# Patient Record
Sex: Male | Born: 1990 | Race: Black or African American | Hispanic: No | Marital: Single | Smoking: Current some day smoker
Health system: Southern US, Community
[De-identification: ages and names within clinical notes are randomized; demographics above are authoritative.]

## PROBLEM LIST (undated history)

## (undated) DIAGNOSIS — N289 Disorder of kidney and ureter, unspecified: Secondary | ICD-10-CM

---
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
## 2010-10-20 ENCOUNTER — Emergency Department (HOSPITAL_COMMUNITY)
Admission: EM | Admit: 2010-10-20 | Discharge: 2010-10-20 | Payer: Self-pay | Source: Home / Self Care | Admitting: Emergency Medicine

## 2011-07-27 ENCOUNTER — Emergency Department (HOSPITAL_COMMUNITY): Payer: BC Managed Care – PPO

## 2011-07-27 ENCOUNTER — Emergency Department (HOSPITAL_COMMUNITY)
Admission: EM | Admit: 2011-07-27 | Discharge: 2011-07-28 | Disposition: A | Discharge status: Home / Self Care | Payer: BC Managed Care – PPO | Attending: Emergency Medicine | Admitting: Emergency Medicine

## 2011-07-27 DIAGNOSIS — R071 Chest pain on breathing: Secondary | ICD-10-CM | POA: Insufficient documentation

## 2011-09-19 ENCOUNTER — Other Ambulatory Visit: Payer: Self-pay | Admitting: Family Medicine

## 2011-09-19 ENCOUNTER — Ambulatory Visit
Admission: RE | Admit: 2011-09-19 | Discharge: 2011-09-19 | Disposition: A | Discharge status: Home / Self Care | Payer: BC Managed Care – PPO | Source: Ambulatory Visit | Attending: Family Medicine | Admitting: Family Medicine

## 2011-09-19 DIAGNOSIS — R0789 Other chest pain: Secondary | ICD-10-CM

## 2019-12-27 ENCOUNTER — Emergency Department (HOSPITAL_COMMUNITY): Payer: BC Managed Care – PPO

## 2019-12-27 ENCOUNTER — Encounter (HOSPITAL_COMMUNITY): Payer: Self-pay

## 2019-12-27 ENCOUNTER — Emergency Department (HOSPITAL_COMMUNITY)
Admission: EM | Admit: 2019-12-27 | Discharge: 2019-12-27 | Disposition: A | Discharge status: Home / Self Care | Payer: BC Managed Care – PPO | Attending: Emergency Medicine | Admitting: Emergency Medicine

## 2019-12-27 ENCOUNTER — Other Ambulatory Visit: Payer: Self-pay

## 2019-12-27 DIAGNOSIS — N200 Calculus of kidney: Secondary | ICD-10-CM | POA: Diagnosis not present

## 2019-12-27 DIAGNOSIS — F1210 Cannabis abuse, uncomplicated: Secondary | ICD-10-CM | POA: Insufficient documentation

## 2019-12-27 DIAGNOSIS — R109 Unspecified abdominal pain: Secondary | ICD-10-CM | POA: Diagnosis present

## 2019-12-27 HISTORY — DX: Disorder of kidney and ureter, unspecified: N28.9

## 2019-12-27 LAB — URINALYSIS, ROUTINE W REFLEX MICROSCOPIC
Bacteria, UA: NONE SEEN
Bilirubin Urine: NEGATIVE
Glucose, UA: NEGATIVE mg/dL
Hgb urine dipstick: NEGATIVE
Ketones, ur: NEGATIVE mg/dL
Leukocytes,Ua: NEGATIVE
Nitrite: NEGATIVE
Protein, ur: 30 mg/dL — AB
Specific Gravity, Urine: 1.025 (ref 1.005–1.030)
pH: 7 (ref 5.0–8.0)

## 2019-12-27 MED ORDER — KETOROLAC TROMETHAMINE 60 MG/2ML IM SOLN
60.0000 mg | Freq: Once | INTRAMUSCULAR | Status: AC
  Administered 2019-12-27: 60 mg via INTRAMUSCULAR
  Filled 2019-12-27: qty 2

## 2019-12-27 MED ORDER — TAMSULOSIN HCL 0.4 MG PO CAPS: 0.4000 mg | ORAL_CAPSULE | Freq: Every day | ORAL | 0 refills | Status: DC

## 2019-12-27 MED ORDER — OXYCODONE-ACETAMINOPHEN 5-325 MG PO TABS: 1.0000 | ORAL_TABLET | ORAL | 0 refills | Status: DC | PRN

## 2019-12-27 MED ORDER — HYDROMORPHONE HCL 1 MG/ML IJ SOLN
1.0000 mg | Freq: Once | INTRAMUSCULAR | Status: DC
  Filled 2019-12-27: qty 1

## 2019-12-27 NOTE — ED Triage Notes (Signed)
patient c/o right flank pain that radiates into the right groin since this AM. Patient states he has been voiding frequent small amounts.  Patient reports a history of kidney stones.

## 2019-12-27 NOTE — ED Provider Notes (Signed)
[facility]-EMERGENCY DEPT Provider Note   CSN: [id number] Arrival date & time: 12/27/19  1611     History Chief Complaint  Patient presents with  . Flank Pain  . Urinary Frequency    Dennis Matthews is a 29 y.o. male.  29 year old male presents acute onset of right-sided flank pain.  Denies any hematuria or dysuria.  No fever or chills.  Pain radiates to his groin.  Cannot find a place of comfort.  Denies any rashes.  No treatment used prior to arrival.  The makes his symptoms better or worse.        Past Medical History:  Diagnosis Date  . Renal disorder     There are no problems to display for this patient.   History reviewed. No pertinent surgical history.     History reviewed. No pertinent family history.  Social History   Tobacco Use  . Smoking status: Never Smoker  . Smokeless tobacco: Never Used  Substance Use Topics  . Alcohol use: Yes  . Drug use: Not Currently    Types: Marijuana    Home Medications Prior to Admission medications   Not on File    Allergies    Patient has no known allergies.  Review of Systems   Review of Systems  All other systems reviewed and are negative.   Physical Exam Updated Vital Signs BP 120/90 (BP Location: Left Arm)   Pulse 89   Temp 98.8 F (37.1 C) (Oral)   Resp 18   Ht 1.651 m (5\' 5" )   Wt 73.5 kg   SpO2 100%   BMI 26.96 kg/m   Physical Exam Vitals and nursing note reviewed.  Constitutional:      General: He is not in acute distress.    Appearance: Normal appearance. He is well-developed. He is not toxic-appearing.  HENT:     Head: Normocephalic and atraumatic.  Eyes:     General: Lids are normal.     Conjunctiva/sclera: Conjunctivae normal.     Pupils: Pupils are equal, round, and reactive to light.  Neck:     Thyroid: No thyroid mass.     Trachea: No tracheal deviation.  Cardiovascular:     Rate and Rhythm: Normal rate and regular rhythm.     Heart sounds: Normal  heart sounds. No murmur. No gallop.   Pulmonary:     Effort: Pulmonary effort is normal. No respiratory distress.     Breath sounds: Normal breath sounds. No stridor. No decreased breath sounds, wheezing, rhonchi or rales.  Abdominal:     General: Bowel sounds are normal. There is no distension.     Palpations: Abdomen is soft.     Tenderness: There is no abdominal tenderness. There is no rebound.     Comments: No CVA tenderness  Genitourinary:    Penis: Normal.      Testes: Normal.  Musculoskeletal:        General: No tenderness. Normal range of motion.     Cervical back: Normal range of motion and neck supple.  Skin:    General: Skin is warm and dry.     Findings: No abrasion or rash.  Neurological:     Mental Status: He is alert and oriented to person, place, and time.     GCS: GCS eye subscore is 4. GCS verbal subscore is 5. GCS motor subscore is 6.     Cranial Nerves: No cranial nerve deficit.     Sensory: No sensory  deficit.  Psychiatric:        Speech: Speech normal.        Behavior: Behavior normal.     ED Results / Procedures / Treatments   Labs (all labs ordered are listed, but only abnormal results are displayed) Labs Reviewed  URINALYSIS, ROUTINE W REFLEX MICROSCOPIC    EKG None  Radiology No results found.  Procedures Procedures (including critical care time)  Medications Ordered in ED Medications  HYDROmorphone (DILAUDID) injection 1 mg (has no administration in time range)    ED Course  I have reviewed the triage vital signs and the nursing notes.  Pertinent labs & imaging results that were available during my care of the patient were reviewed by me and considered in my medical decision making (see chart for details).    MDM Rules/Calculators/A&P                      Urinalysis negative for infection.  Renal CT shows mild right-sided hydronephrosis with possible 1 mm stone.  Patient medicated with Toradol and feels better.  Will prescribe pain  meds, Flomax, and give referral to urology Final Clinical Impression(s) / ED Diagnoses Final diagnoses:  None    Rx / DC Orders ED Discharge Orders    None       Lacretia Leigh, MD 12/27/19 1914

## 2019-12-27 NOTE — ED Notes (Signed)
Pt A/Ox4 and ambulatory without concern. Pt educated on follow up care and presecretions. Pt verbalized understanding and denies questions.

## 2019-12-28 NOTE — ED Notes (Signed)
Opened chart at pts request to print work note. 

## 2020-10-12 ENCOUNTER — Other Ambulatory Visit: Payer: BC Managed Care – PPO

## 2020-10-12 DIAGNOSIS — Z20822 Contact with and (suspected) exposure to covid-19: Secondary | ICD-10-CM

## 2020-10-15 LAB — SARS-COV-2, NAA 2 DAY TAT

## 2020-10-15 LAB — NOVEL CORONAVIRUS, NAA: SARS-CoV-2, NAA: DETECTED — AB

## 2020-10-16 ENCOUNTER — Telehealth: Payer: Self-pay | Admitting: Adult Health

## 2020-10-16 ENCOUNTER — Ambulatory Visit: Payer: Self-pay | Admitting: *Deleted

## 2020-10-16 NOTE — Telephone Encounter (Signed)
Called to discuss with patient about COVID-19 symptoms and the use of one of the available treatments for those with mild to moderate Covid symptoms and at a high risk of hospitalization.  Pt appears to qualify for outpatient treatment due to co-morbid conditions and/or a member of an at-risk group in accordance with the FDA Emergency Use Authorization.    Symptom onset: 1/9 Vaccinated: No  Booster? No  Immunocompromised? No  Qualifiers: AAM , no vaccine   Patient declines at this time , will call back if changes mind.  Continue with previous covid 19 care instructions   Rubye Oaks NP

## 2020-10-16 NOTE — Telephone Encounter (Signed)
Patient notified of positive COVID-19 test results. Pt verbalized understanding. Pt reports symptoms of fever, cough and now getter better. Criteria for self-isolation if you test positive for COVID-19, regardless of vaccination status:  -If you have mild symptoms that are resolving or have resolved, isolate at home for 5 days since symptoms started AND continue to wear a well-fitted mask when around others in the home and in public for 5 additional days after isolation is completed -If you have a fever and/or moderate to severe symptoms, isolate for at least 10 days since the symptoms started AND until you are fever free for at least 24 hours without the use of fever-reducing medications -If you tested positive and did not have symptoms, isolate for at least 5 days after your positive test  Use over-the-counter medications for symptoms.If you develop respiratory issues/distress, seek medical care in the Emergency Department.  If you must leave home or if you have to be around others please wear a mask. Please limit contact with immediate family members in the home, practice social distancing, frequent handwashing and clean hard surfaces touched frequently with household cleaning products. Members of your household will also need to quarantine and test.You may also be contacted by the health department for follow up. [facility] Department notified.

## 2020-10-17 IMAGING — CT CT RENAL STONE PROTOCOL
2 of 3 series · 16 of 46 positions shown, 18 images · non-contrast
Comparison: None.

CLINICAL DATA: Right flank pain since this morning.

EXAM:
CT ABDOMEN AND PELVIS WITHOUT CONTRAST
TECHNIQUE: Multidetector CT imaging of the abdomen and pelvis was performed
following the standard protocol without IV contrast.

[Series 4: lung bases · axial · 0.68mm/px · z∈[-8,+78]mm · 13 of 51 slices shown, 15 images]
[im 4/51  soft-tissue]
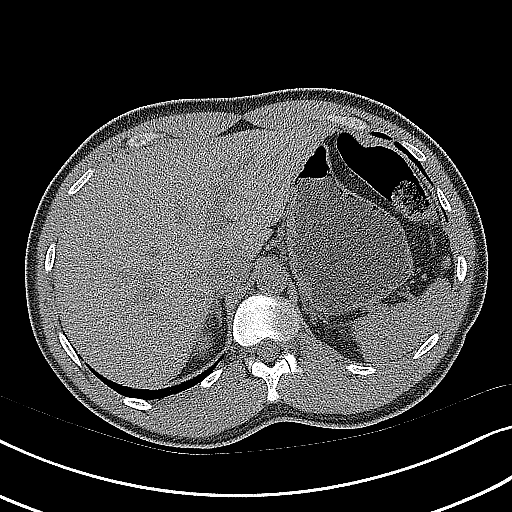
[im 4/51  bone]
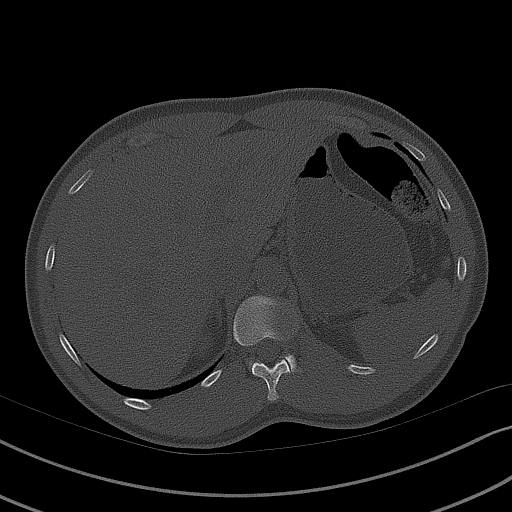
[im 7/51  soft-tissue]
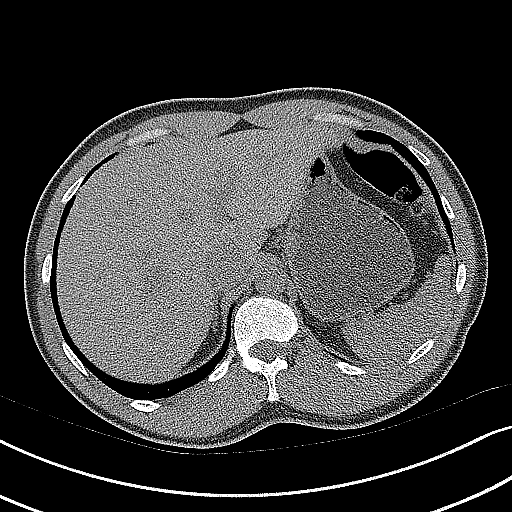
[im 10/51  soft-tissue]
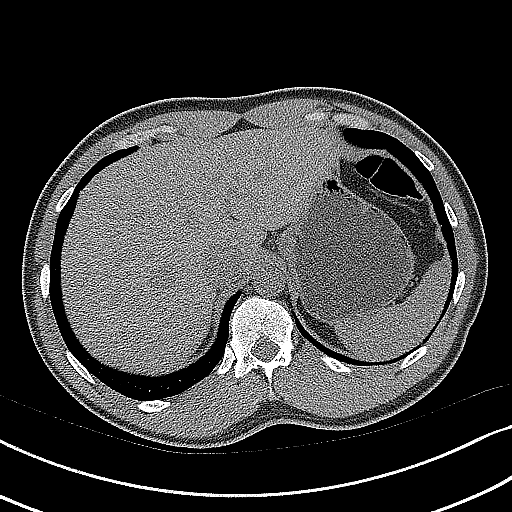
[im 15/51  soft-tissue]
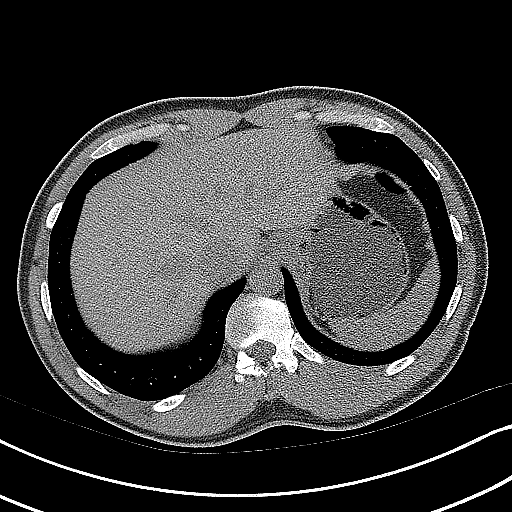
[im 18/51  soft-tissue]
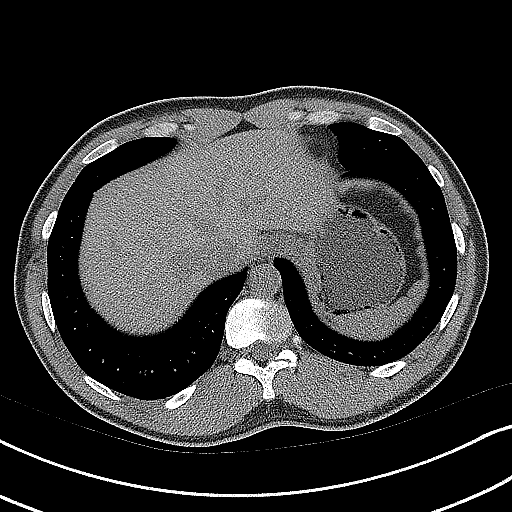
[im 21/51  soft-tissue]
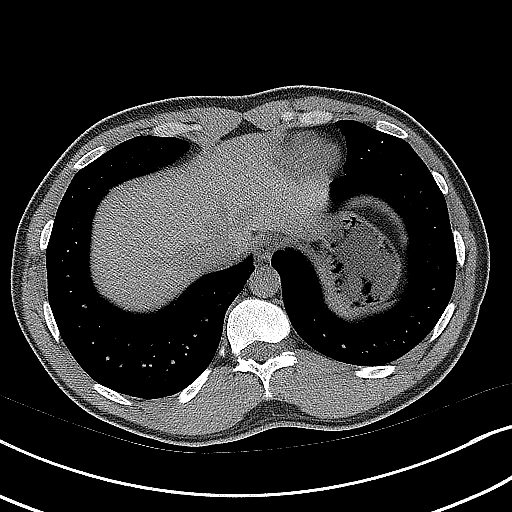
[im 26/51  soft-tissue]
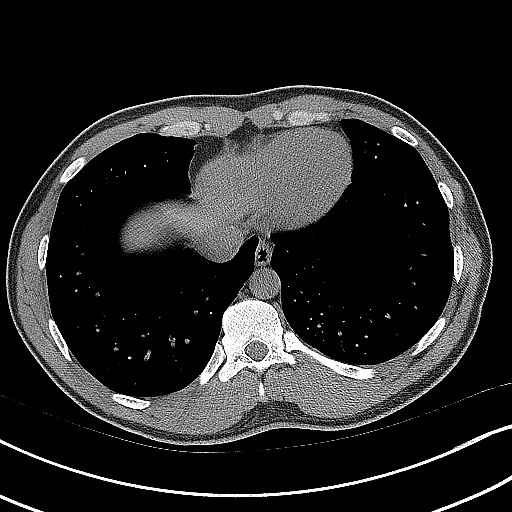
[im 30/51  soft-tissue]
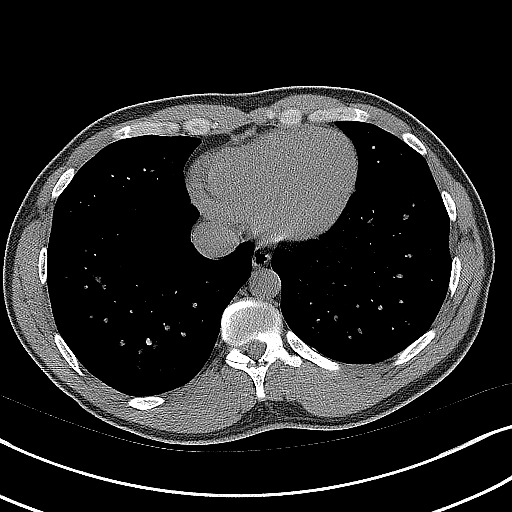
[im 33/51  soft-tissue]
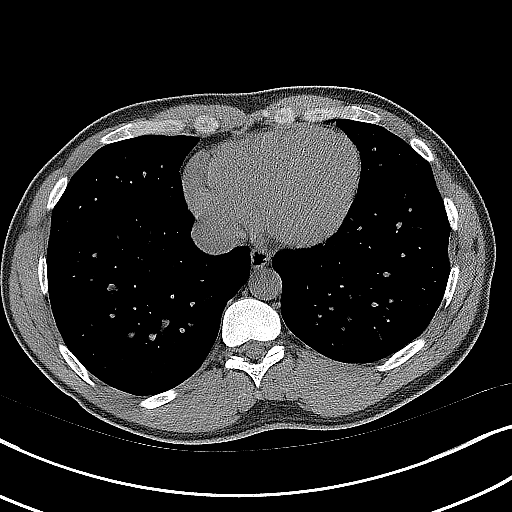
[im 33/51  bone]
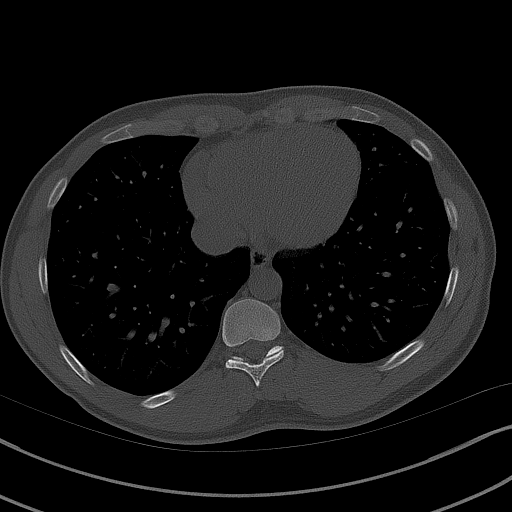
[im 36/51  soft-tissue]
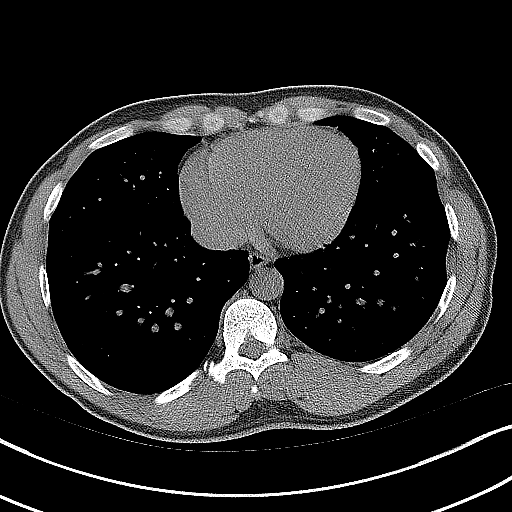
[im 41/51  soft-tissue]
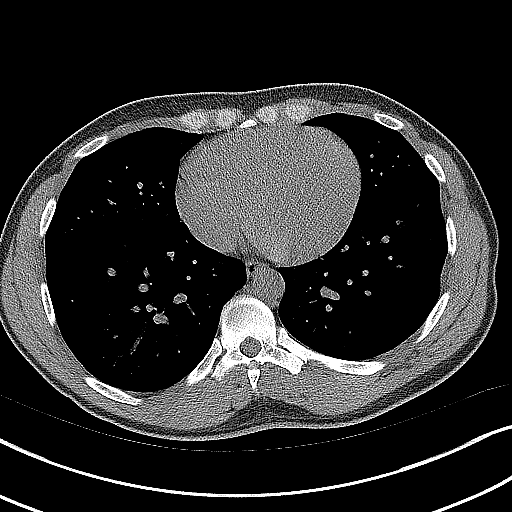
[im 44/51  soft-tissue]
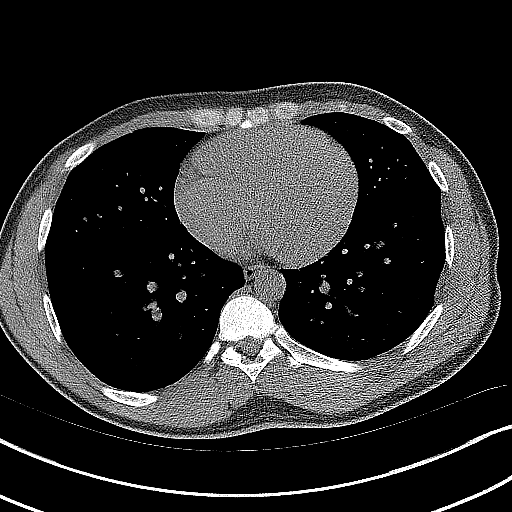
[im 47/51  soft-tissue]
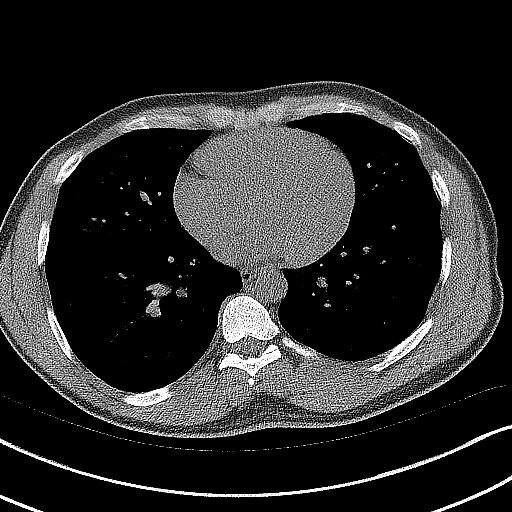

[Series 5: coronal · coronal · 0.69mm/px · 3 of 106 slices shown]
[im 36/106  soft-tissue]
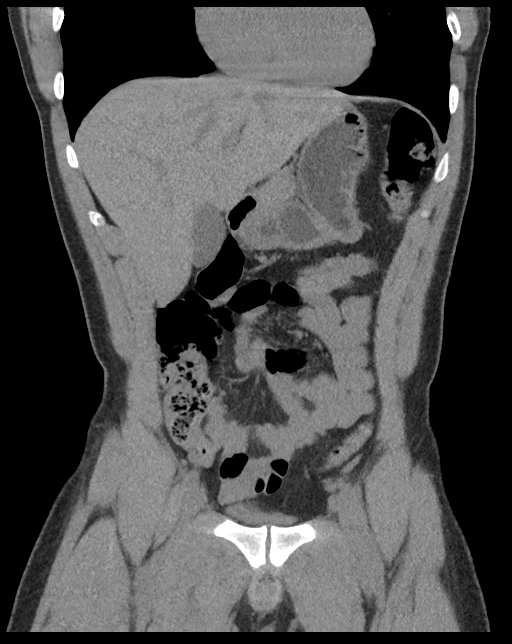
[im 47/106  soft-tissue]
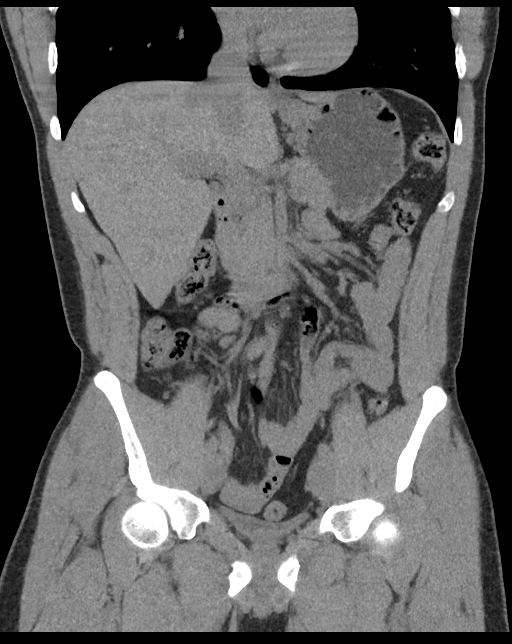
[im 59/106  soft-tissue]
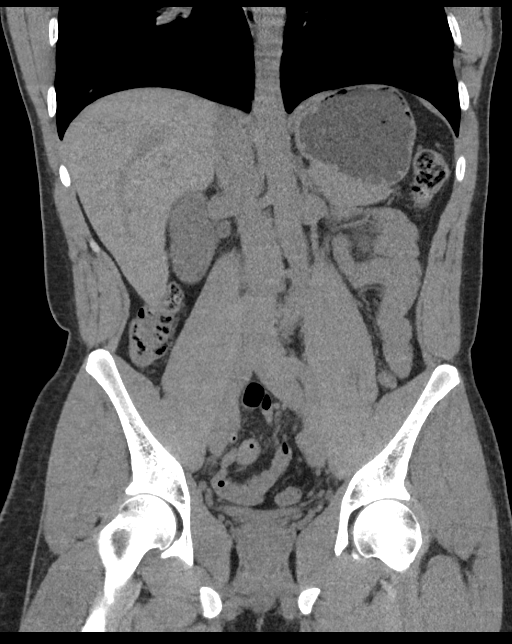

[16 of 46 positions shown; findings below may reference images not displayed]

FINDINGS: Lower chest: No acute abnormality.

Hepatobiliary: No focal liver abnormality is seen. No gallstones,
gallbladder wall thickening, or biliary dilatation.

Pancreas: Unremarkable. No pancreatic ductal dilatation or
surrounding inflammatory changes.

Spleen: Normal in size without focal abnormality.

Adrenals/Urinary Tract: The bilateral adrenal glands are normal.
There is mild right hydronephrosis with question 1 mm stone in the
distal right ureter best seen on image 71 series 2. The left kidney
is normal. There is no left hydronephrosis. The bladder is
decompressed limiting evaluation.

Stomach/Bowel: Stomach is within normal limits. Appendix appears
normal. No evidence of bowel wall thickening, distention, or
inflammatory changes.

Vascular/Lymphatic: No significant vascular findings are present. No
enlarged abdominal or pelvic lymph nodes.

Reproductive: Prostate is unremarkable.

Other: None.

Musculoskeletal: No acute or significant osseous findings.
IMPRESSION: Mild right hydronephrosis with question 1 mm stone in the distal
right ureter.

## 2021-03-10 ENCOUNTER — Emergency Department (HOSPITAL_COMMUNITY)
Admission: EM | Admit: 2021-03-10 | Discharge: 2021-03-11 | Disposition: A | Discharge status: Home / Self Care | Payer: BC Managed Care – PPO | Attending: Emergency Medicine | Admitting: Emergency Medicine

## 2021-03-10 DIAGNOSIS — N50812 Left testicular pain: Secondary | ICD-10-CM

## 2021-03-10 DIAGNOSIS — N50819 Testicular pain, unspecified: Secondary | ICD-10-CM

## 2021-03-10 DIAGNOSIS — Z20822 Contact with and (suspected) exposure to covid-19: Secondary | ICD-10-CM | POA: Diagnosis not present

## 2021-03-10 DIAGNOSIS — N50811 Right testicular pain: Secondary | ICD-10-CM | POA: Diagnosis present

## 2021-03-11 ENCOUNTER — Emergency Department (HOSPITAL_COMMUNITY): Payer: BC Managed Care – PPO

## 2021-03-11 ENCOUNTER — Other Ambulatory Visit: Payer: Self-pay

## 2021-03-11 LAB — URINALYSIS, ROUTINE W REFLEX MICROSCOPIC
Bacteria, UA: NONE SEEN
Bilirubin Urine: NEGATIVE
Glucose, UA: NEGATIVE mg/dL
Hgb urine dipstick: NEGATIVE
Ketones, ur: 80 mg/dL — AB
Leukocytes,Ua: NEGATIVE
Nitrite: NEGATIVE
Protein, ur: NEGATIVE mg/dL
Specific Gravity, Urine: 1.019 (ref 1.005–1.030)
pH: 6 (ref 5.0–8.0)

## 2021-03-11 LAB — RESP PANEL BY RT-PCR (FLU A&B, COVID) ARPGX2
Influenza A by PCR: NEGATIVE
Influenza B by PCR: NEGATIVE
SARS Coronavirus 2 by RT PCR: NEGATIVE

## 2021-03-11 NOTE — ED Triage Notes (Addendum)
Pt states his dad is covid positive x 1wk and wants to get a covid test. Pt states he was rapping in the car and after awhile felt short of breath briefly. Pt also c/o bilateral testicle discomfort moving up to L abd that started approx 1hr ago. Denies n/v/d, fevers or dysuria.

## 2021-03-11 NOTE — Discharge Instructions (Addendum)
Take ibuprofen 600 mg every 6 hours as needed for pain.  Follow-up with your primary doctor if symptoms are not improving in the next few days. 

## 2021-03-11 NOTE — ED Provider Notes (Signed)
[facility]EMERGENCY DEPT Provider Note   CSN: [id number] Arrival date & time: 03/10/21  2320     History Chief Complaint  Patient presents with   Testicle Pain    Dennis Matthews is a 30 y.o. male.  Patient is a 30 year old male with no significant past medical history.  He presents today for evaluation of testicle pain.  He describes discomfort to both testicles since yesterday evening.  This began in the absence of any injury or trauma.  He denies dysuria or urethral discharge.  He states that he has not been sexually active in many months and highly doubts possibility of an STD.  The history is provided by the patient.  Testicle Pain This is a new problem. The current episode started yesterday. The problem occurs constantly. The problem has been gradually worsening. Nothing aggravates the symptoms. Nothing relieves the symptoms.      Past Medical History:  Diagnosis Date   Renal disorder     There are no problems to display for this patient.   No past surgical history on file.     No family history on file.  Social History   Tobacco Use   Smoking status: Never   Smokeless tobacco: Never  Vaping Use   Vaping Use: Never used  Substance Use Topics   Alcohol use: Yes   Drug use: Not Currently    Types: Marijuana    Home Medications Prior to Admission medications   Medication Sig Start Date End Date Taking? Authorizing Provider  oxyCODONE-acetaminophen (PERCOCET/ROXICET) 5-325 MG tablet Take 1-2 tablets by mouth every 4 (four) hours as needed for severe pain. 12/27/19   Lorre Nick, MD  tamsulosin (FLOMAX) 0.4 MG CAPS capsule Take 1 capsule (0.4 mg total) by mouth daily. 12/27/19   Lorre Nick, MD    Allergies    Patient has no known allergies.  Review of Systems   Review of Systems  Genitourinary:  Positive for testicular pain.  All other systems reviewed and are negative.  Physical Exam Updated Vital Signs BP 111/74    Pulse 63   Temp 98.1 F (36.7 C) (Oral)   Resp 18   Ht 5\' 5"  (1.651 m)   Wt 70.3 kg   SpO2 98%   BMI 25.79 kg/m   Physical Exam Vitals and nursing note reviewed.  Constitutional:      General: He is not in acute distress.    Appearance: He is well-developed. He is not diaphoretic.  HENT:     Head: Normocephalic and atraumatic.  Cardiovascular:     Rate and Rhythm: Normal rate and regular rhythm.     Heart sounds: No murmur heard.   No friction rub.  Pulmonary:     Effort: Pulmonary effort is normal. No respiratory distress.     Breath sounds: Normal breath sounds. No wheezing or rales.  Abdominal:     General: Bowel sounds are normal. There is no distension.     Palpations: Abdomen is soft.     Tenderness: There is no abdominal tenderness.  Genitourinary:    Penis: Normal.      Testes: Normal.     Comments: External genitalia is normal in appearance.  There is no evidence for torsion.  Both testicles are freely mobile within the scrotal sac.  There are no rashes or lesions. Musculoskeletal:        General: Normal range of motion.     Cervical back: Normal range of motion and neck supple.  Skin:    General: Skin is warm and dry.  Neurological:     Mental Status: He is alert and oriented to person, place, and time.     Coordination: Coordination normal.    ED Results / Procedures / Treatments   Labs (all labs ordered are listed, but only abnormal results are displayed) Labs Reviewed  RESP PANEL BY RT-PCR (FLU A&B, COVID) ARPGX2  URINALYSIS, ROUTINE W REFLEX MICROSCOPIC  GC/CHLAMYDIA PROBE AMP ([place]) NOT AT [facility]    EKG None  Radiology No results found.  Procedures Procedures   Medications Ordered in ED Medications - No data to display  ED Course  I have reviewed the triage vital signs and the nursing notes.  Pertinent labs & imaging results that were available during my care of the patient were reviewed by me and considered in my medical decision  making (see chart for details).    MDM Rules/Calculators/A&P  Patient's laboratory studies and urinalysis unremarkable.  Ultrasound shows no evidence for torsion or other significant abnormality.  At this point I highly doubt an infectious source.  GC and Chlamydia cultures are pending.  Patient will be discharged with ibuprofen and follow-up as needed.  Patient also request a COVID test due to his father recently testing positive.  His COVID test today is negative.  Final Clinical Impression(s) / ED Diagnoses Final diagnoses:  Testicle pain    Rx / DC Orders ED Discharge Orders     None        Geoffery Lyons, MD 03/11/21 [phone]

## 2021-03-11 NOTE — ED Notes (Signed)
Asked pt to provide urine sample, states he is unable to at this time.

## 2021-03-16 ENCOUNTER — Encounter (HOSPITAL_COMMUNITY): Payer: Self-pay

## 2021-03-16 ENCOUNTER — Ambulatory Visit: Payer: Self-pay | Admitting: *Deleted

## 2021-03-16 ENCOUNTER — Emergency Department (HOSPITAL_COMMUNITY)
Admission: EM | Admit: 2021-03-16 | Discharge: 2021-03-16 | Disposition: A | Discharge status: Home / Self Care | Payer: BC Managed Care – PPO | Attending: Emergency Medicine | Admitting: Emergency Medicine

## 2021-03-16 ENCOUNTER — Other Ambulatory Visit: Payer: Self-pay

## 2021-03-16 DIAGNOSIS — M79605 Pain in left leg: Secondary | ICD-10-CM | POA: Insufficient documentation

## 2021-03-16 DIAGNOSIS — N50812 Left testicular pain: Secondary | ICD-10-CM | POA: Diagnosis not present

## 2021-03-16 DIAGNOSIS — M79604 Pain in right leg: Secondary | ICD-10-CM | POA: Insufficient documentation

## 2021-03-16 DIAGNOSIS — N5082 Scrotal pain: Secondary | ICD-10-CM | POA: Insufficient documentation

## 2021-03-16 DIAGNOSIS — N50811 Right testicular pain: Secondary | ICD-10-CM | POA: Diagnosis present

## 2021-03-16 LAB — URINALYSIS, ROUTINE W REFLEX MICROSCOPIC
Bilirubin Urine: NEGATIVE
Glucose, UA: NEGATIVE mg/dL
Hgb urine dipstick: NEGATIVE
Ketones, ur: NEGATIVE mg/dL
Leukocytes,Ua: NEGATIVE
Nitrite: NEGATIVE
Protein, ur: NEGATIVE mg/dL
Specific Gravity, Urine: 1.003 — ABNORMAL LOW (ref 1.005–1.030)
pH: 6 (ref 5.0–8.0)

## 2021-03-16 MED ORDER — ACETAMINOPHEN 325 MG PO TABS: 650.0000 mg | ORAL_TABLET | Freq: Four times a day (QID) | ORAL | 0 refills | Status: DC | PRN

## 2021-03-16 NOTE — ED Triage Notes (Signed)
Pt c/o bilateral groin and leg pain x 1 wk, seen last week for same. Denies fevers or urinary symptoms

## 2021-03-16 NOTE — ED Provider Notes (Signed)
Emergency Medicine Provider Triage Evaluation Note  Dennis Matthews , a 30 y.o. male  was evaluated in triage.  Pt with vague complaints of ongoing testicle pain (seen in the ED a few days ago for same with normal ultrasound), pain in his hamstrings bilaterally without fall or injury.  Also feels like he has low urine output despite drinking plenty of water and having clear urine.  Patient states that he is in the ER a year ago and told he had a 1 mm kidney stone and he wants to know if he has passed it.  Review of Systems  Positive: Testicle pain, leg pain Negative: Dysuria, hematuria, abdominal pain, flank pain, vomiting  Physical Exam  BP (!) 134/96 (BP Location: Left Arm)   Pulse 76   Temp 97.8 F (36.6 C) (Oral)   Resp 15   SpO2 100%  Gen:   Awake, no distress   Resp:  Normal effort  MSK:   Moves extremities without difficulty  Other:  No CVA tenderness, abdomen is soft and non tender  Medical Decision Making  Medically screening exam initiated at 9:04 PM.  Appropriate orders placed.  [name] was informed that the remainder of the evaluation will be completed by another provider, this initial triage assessment does not replace that evaluation, and the importance of remaining in the ED until their evaluation is complete.     Jeannie Fend, PA-C 03/16/21 2110    Lorre Nick, MD 03/18/21 2253

## 2021-03-16 NOTE — ED Provider Notes (Signed)
[facility]EMERGENCY [facility]PT Provider Note   CSN: [id number] Arrival date & time: 03/16/21  2039     History Chief Complaint  Patient presents with   Groin Swelling    Dennis Matthews is a 30 y.o. male present emergency department with multiple complaints.  The patient reports that he has discontinued discomfort in both of his testicles.  He also was pain that radiates down the back of both of his legs towards his calf.  This began about 2 to 3 weeks ago.  He was seen in the emergency department last week for similar complaints, had an unremarkable scrotal ultrasound at that time.  He denies fevers or chills.  He denies falls, trauma, back injuries.  He denies IV drug use.  No fever, chills, numbness, or objective weakness on exam. No saddle anesthesia, urinary or fecal incontinence or retention. No reported history of immunosuppression, IV drug use, cancer, recent spinal surgery, recent trauma, or falls.    HPI     Past Medical History:  Diagnosis Date   Renal disorder     There are no problems to display for this patient.   History reviewed. No pertinent surgical history.     History reviewed. No pertinent family history.  Social History   Tobacco Use   Smoking status: Never   Smokeless tobacco: Never  Vaping Use   Vaping Use: Never used  Substance Use Topics   Alcohol use: Yes   Drug use: Not Currently    Types: Marijuana    Home Medications Prior to Admission medications   Medication Sig Start Date End Date Taking? Authorizing Provider  acetaminophen (TYLENOL) 325 MG tablet Take 2 tablets (650 mg total) by mouth every 6 (six) hours as needed for up to 30 doses for mild pain or moderate pain. 03/16/21  Yes Gurbani Figge, Kermit Balo, MD  oxyCODONE-acetaminophen (PERCOCET/ROXICET) 5-325 MG tablet Take 1-2 tablets by mouth every 4 (four) hours as needed for severe pain. 12/27/19   Lorre Nick, MD  tamsulosin (FLOMAX) 0.4 MG CAPS capsule Take 1  capsule (0.4 mg total) by mouth daily. 12/27/19   Lorre Nick, MD    Allergies    Patient has no known allergies.  Review of Systems   Review of Systems  Constitutional:  Negative for chills and fever.  Respiratory:  Negative for cough and shortness of breath.   Cardiovascular:  Negative for chest pain and palpitations.  Gastrointestinal:  Negative for abdominal pain and vomiting.  Genitourinary:  Positive for testicular pain. Negative for decreased urine volume, dysuria, hematuria and penile swelling.  Musculoskeletal:  Positive for arthralgias and myalgias. Negative for back pain.  Skin:  Negative for color change and rash.  Neurological:  Negative for weakness, numbness and headaches.  All other systems reviewed and are negative.  Physical Exam Updated Vital Signs BP 130/90 (BP Location: Right Arm)   Pulse 75   Temp 97.9 F (36.6 C) (Oral)   Resp 16   SpO2 100%   Physical Exam Constitutional:      General: He is not in acute distress. HENT:     Head: Normocephalic and atraumatic.  Eyes:     Conjunctiva/sclera: Conjunctivae normal.     Pupils: Pupils are equal, round, and reactive to light.  Cardiovascular:     Rate and Rhythm: Normal rate and regular rhythm.  Pulmonary:     Effort: Pulmonary effort is normal. No respiratory distress.  Abdominal:     General: There is no distension.  Tenderness: There is no abdominal tenderness.  Genitourinary:    Penis: Normal.      Testes: Normal.  Musculoskeletal:        General: No swelling or tenderness.  Skin:    General: Skin is warm and dry.  Neurological:     General: No focal deficit present.     Mental Status: He is alert and oriented to person, place, and time. Mental status is at baseline.     Motor: No weakness.  Psychiatric:        Mood and Affect: Mood normal.        Behavior: Behavior normal.    ED Results / Procedures / Treatments   Labs (all labs ordered are listed, but only abnormal results are  displayed) Labs Reviewed  URINALYSIS, ROUTINE W REFLEX MICROSCOPIC - Abnormal; Notable for the following components:      Result Value   Color, Urine COLORLESS (*)    Specific Gravity, Urine 1.003 (*)    All other components within normal limits    EKG None  Radiology No results found.  Procedures Procedures   Medications Ordered in ED Medications - No data to display  ED Course  I have reviewed the triage vital signs and the nursing notes.  Pertinent labs & imaging results that were available during my care of the patient were reviewed by me and considered in my medical decision making (see chart for details).  This is a young fairly healthy 30 year old male here with nonspecific complaint of scrotal fullness.  He had a normal ultrasound several days ago for the same complaints.  I reviewed his urine today, does not show sign of infection, or blood to suggest a urine stone.  He does not have any inguinal hernia.  No evidence of testicular torsion on exam.  He has a benign GU exam.  Suspect he may be having some sciatic issues in the lower back as it does radiate down both legs towards the calf.  He has negative straight leg test.  He has no neurological deficits.  No red flags for cauda equina syndrome.  I doubt epidural abscess.  I advised continue conservative management with Tylenol for several days.  He has adverse reactions to NSAIDs (stomach upset).  Doubt DVT, cellulitis, infection at this time.     Final Clinical Impression(s) / ED Diagnoses Final diagnoses:  Scrotal pain  Pain in both lower extremities    Rx / DC Orders ED Discharge Orders          Ordered    acetaminophen (TYLENOL) 325 MG tablet  Every 6 hours PRN        03/16/21 2219             Terald Sleeper, MD 03/17/21 1006

## 2021-03-16 NOTE — Telephone Encounter (Signed)
Pt seen in ED 03/10/21 for all symptoms mentioned today plus reports cramping in both hamstrings. States radiates to knees at times, worse at rest, "Not so much when active." Reports 3/10 back pain. States testicular pain remains. Advised UC. States will follow disposition.

## 2021-03-16 NOTE — Telephone Encounter (Signed)
Reason for Disposition . [1] Thigh or calf pain AND [2] only 1 side AND [3] present > 1 hour (Exception: chronic unchanged pain)  Answer Assessment - Initial Assessment Questions 1. ONSET: "When did the pain start?"      2-3 weeks ago 2. LOCATION: "Where is the pain located?"    Both hamstrings 3. PAIN: "How bad is the pain?"    (Scale 1-10; or mild, moderate, severe)   -  MILD (1-3): doesn't interfere with normal activities    -  MODERATE (4-7): interferes with normal activities (e.g., work or school) or awakens from sleep, limping    -  SEVERE (8-10): excruciating pain, unable to do any normal activities, unable to walk     Cramping, none at rest 4. WORK OR EXERCISE: "Has there been any recent work or exercise that involved this part of the body?"      no 5. CAUSE: "What do you think is causing the leg pain?"     Unsure 6. OTHER SYMPTOMS: "Do you have any other symptoms?" (e.g., chest pain, back pain, breathing difficulty, swelling, rash, fever, numbness, weakness)    Testicular pain, back pain 3/10.  Protocols used: Leg Pain-A-AH

## 2021-04-15 ENCOUNTER — Inpatient Hospital Stay (INDEPENDENT_AMBULATORY_CARE_PROVIDER_SITE_OTHER): Payer: BC Managed Care – PPO | Admitting: Primary Care

## 2021-11-18 ENCOUNTER — Ambulatory Visit (INDEPENDENT_AMBULATORY_CARE_PROVIDER_SITE_OTHER): Payer: BC Managed Care – PPO | Admitting: Primary Care

## 2021-12-25 ENCOUNTER — Emergency Department (HOSPITAL_COMMUNITY)
Admission: EM | Admit: 2021-12-25 | Discharge: 2021-12-25 | Disposition: A | Discharge status: Home / Self Care | Payer: BC Managed Care – PPO | Attending: Emergency Medicine | Admitting: Emergency Medicine

## 2021-12-25 ENCOUNTER — Other Ambulatory Visit: Payer: Self-pay

## 2021-12-25 ENCOUNTER — Emergency Department (HOSPITAL_COMMUNITY): Payer: BC Managed Care – PPO

## 2021-12-25 ENCOUNTER — Encounter (HOSPITAL_COMMUNITY): Payer: Self-pay | Admitting: Emergency Medicine

## 2021-12-25 DIAGNOSIS — R002 Palpitations: Secondary | ICD-10-CM | POA: Insufficient documentation

## 2021-12-25 DIAGNOSIS — F1290 Cannabis use, unspecified, uncomplicated: Secondary | ICD-10-CM | POA: Diagnosis not present

## 2021-12-25 DIAGNOSIS — F419 Anxiety disorder, unspecified: Secondary | ICD-10-CM | POA: Diagnosis not present

## 2021-12-25 DIAGNOSIS — R000 Tachycardia, unspecified: Secondary | ICD-10-CM | POA: Insufficient documentation

## 2021-12-25 LAB — BASIC METABOLIC PANEL
Anion gap: 8 (ref 5–15)
BUN: 8 mg/dL (ref 6–20)
CO2: 27 mmol/L (ref 22–32)
Calcium: 9.7 mg/dL (ref 8.9–10.3)
Chloride: 103 mmol/L (ref 98–111)
Creatinine, Ser: 0.89 mg/dL (ref 0.61–1.24)
GFR, Estimated: >60 mL/min (ref >60)
Glucose, Bld: 102 mg/dL — ABNORMAL HIGH (ref 70–99)
Potassium: 3.6 mmol/L (ref 3.5–5.1)
Sodium: 138 mmol/L (ref 135–145)

## 2021-12-25 LAB — CBC
HCT: 46.7 % (ref 39.0–52.0)
Hemoglobin: 15.8 g/dL (ref 13.0–17.0)
MCH: 27.5 pg (ref 26.0–34.0)
MCHC: 33.8 g/dL (ref 30.0–36.0)
MCV: 81.4 fL (ref 80.0–100.0)
Platelets: 233 10*3/uL (ref 150–400)
RBC: 5.74 MIL/uL (ref 4.22–5.81)
RDW: 12.7 % (ref 11.5–15.5)
WBC: 11.7 10*3/uL — ABNORMAL HIGH (ref 4.0–10.5)
nRBC: 0 % (ref 0.0–0.2)

## 2021-12-25 MED ORDER — SODIUM CHLORIDE 0.9 % IV BOLUS
500.0000 mL | Freq: Once | INTRAVENOUS | Status: AC
  Administered 2021-12-25: 500 mL via INTRAVENOUS

## 2021-12-25 NOTE — ED Triage Notes (Signed)
Pt smoked some weed earlier and is feeling anxious. Ambulatory. A&Ox4.  ?

## 2021-12-25 NOTE — ED Provider Notes (Signed)
?[name] ?Provider Note ? ? ?CSN: [id number] ?Arrival date & time: 12/25/21  1913 ? ?  ? ?History ? ?Chief Complaint  ?Patient presents with  ? Anxiety  ? ? ?Dennis Matthews is a 31 y.o. male. ? ?Patient here with palpitations after marijuana use.  No significant medical history.  Denies any chest pain, shortness of breath.  Palpitations are improving.  Does not think there was anything else in the marijuana.  Denies any nausea or vomiting.  No abdominal pain.  No headaches.  No fall or trauma.  Nothing has made it worse or better. ? ?The history is provided by the patient.  ?Anxiety ? ? ?  ? ?Home Medications ?Prior to Admission medications   ?Medication Sig Start Date End Date Taking? Authorizing Provider  ?acetaminophen (TYLENOL) 325 MG tablet Take 2 tablets (650 mg total) by mouth every 6 (six) hours as needed for up to 30 doses for mild pain or moderate pain. 03/16/21   Terald Sleeper, MD  ?oxyCODONE-acetaminophen (PERCOCET/ROXICET) 5-325 MG tablet Take 1-2 tablets by mouth every 4 (four) hours as needed for severe pain. 12/27/19   Lorre Nick, MD  ?tamsulosin (FLOMAX) 0.4 MG CAPS capsule Take 1 capsule (0.4 mg total) by mouth daily. 12/27/19   Lorre Nick, MD  ?   ? ?Allergies    ?Patient has no known allergies.   ? ?Review of Systems   ?Review of Systems ? ?Physical Exam ?Updated Vital Signs ?BP (!) 137/100 (BP Location: Right Arm)   Pulse (!) 115   Temp 98.1 ?F (36.7 ?C) (Oral)   Resp (!) 22   Ht 5\' 5"  (1.651 m)   Wt 70.3 kg   SpO2 96%   BMI 25.79 kg/m?  ?Physical Exam ?Vitals and nursing note reviewed.  ?Constitutional:   ?   General: He is not in acute distress. ?   Appearance: He is well-developed.  ?HENT:  ?   Head: Normocephalic and atraumatic.  ?   Mouth/Throat:  ?   Mouth: Mucous membranes are moist.  ?Eyes:  ?   Extraocular Movements: Extraocular movements intact.  ?   Conjunctiva/sclera: Conjunctivae normal.  ?   Pupils: Pupils are equal, round, and  reactive to light.  ?Cardiovascular:  ?   Rate and Rhythm: Regular rhythm. Tachycardia present.  ?   Heart sounds: No murmur heard. ?Pulmonary:  ?   Effort: Pulmonary effort is normal. No respiratory distress.  ?   Breath sounds: Normal breath sounds.  ?Abdominal:  ?   Palpations: Abdomen is soft.  ?   Tenderness: There is no abdominal tenderness.  ?Musculoskeletal:     ?   General: No swelling.  ?   Cervical back: Neck supple.  ?Skin: ?   General: Skin is warm and dry.  ?   Capillary Refill: Capillary refill takes less than 2 seconds.  ?Neurological:  ?   General: No focal deficit present.  ?   Mental Status: He is alert and oriented to person, place, and time.  ?   Cranial Nerves: No cranial nerve deficit.  ?   Sensory: No sensory deficit.  ?   Motor: No weakness.  ?   Coordination: Coordination normal.  ?   Comments: 5+ out of 5 strength throughout, normal sensation, no drift, normal finger-nose-finger, normal speech  ?Psychiatric:     ?   Mood and Affect: Mood normal.  ? ? ?ED Results / Procedures / Treatments   ?Labs ?(all labs  ordered are listed, but only abnormal results are displayed) ?Labs Reviewed  ?CBC - Abnormal; Notable for the following components:  ?    Result Value  ? WBC 11.7 (*)   ? All other components within normal limits  ?BASIC METABOLIC PANEL - Abnormal; Notable for the following components:  ? Glucose, Bld 102 (*)   ? All other components within normal limits  ? ? ?EKG ?EKG Interpretation ? ?Date/Time:  Saturday December 25 2021 21:02:24 EDT ?Ventricular Rate:  73 ?PR Interval:  181 ?QRS Duration: 100 ?QT Interval:  375 ?QTC Calculation: 414 ?R Axis:   43 ?Text Interpretation: Sinus rhythm Consider left atrial enlargement Confirmed by Virgina Norfolk [phone]) on 12/25/2021 9:06:36 PM ? ?Radiology ?DG Chest 2 View ? ?Result Date: 12/25/2021 ?CLINICAL DATA:  Shortness of breath, marijuana use, anxiety EXAM: CHEST - 2 VIEW COMPARISON:  07/28/2011 FINDINGS: The heart size and mediastinal contours are  within normal limits. Both lungs are clear. The visualized skeletal structures are unremarkable. IMPRESSION: No active cardiopulmonary disease. Electronically Signed   By: Sharlet Salina M.D.   On: 12/25/2021 20:17   ? ?Procedures ?Procedures  ? ? ?Medications Ordered in ED ?Medications  ?sodium chloride 0.9 % bolus 500 mL (500 mLs Intravenous New Bag/Given 12/25/21 2053)  ? ? ?ED Course/ Medical Decision Making/ A&P ?  ?                        ?Medical Decision Making ? ?[name] is here with palpitations and anxiety after smoking marijuana.  Heart rate 115 upon arrival but otherwise normal vitals.  Patient otherwise denies any chest pain, shortness of breath, abdominal pain, nausea, vomiting.  Neurologically he is intact.  Does not think that there is any coingestions.  Overall suspect symptoms secondary to marijuana use.  Will check electrolytes, chest x-ray, EKG to look for any other abnormality in electrolytes or infectious process.  Doubt ACS or PE. ? ?Per my review of labs and interpretation there is no significant anemia, electrolyte abnormality.  Chest x-ray per my review and interpretation shows no pneumonia or pneumothorax.  EKG shows sinus rhythm.  Overall patient feeling better after some IV fluids.  Overall suspect palpitations secondary to marijuana use.  Discharged in good condition. ? ?This chart was dictated using voice recognition software.  Despite best efforts to proofread,  errors can occur which can change the documentation meaning.  ? ? ? ? ? ? ? ?Final Clinical Impression(s) / ED Diagnoses ?Final diagnoses:  ?Palpitations  ? ? ?Rx / DC Orders ?ED Discharge Orders   ? ? None  ? ?  ? ? ?  ?Virgina Norfolk, DO ?12/25/21 2253 ? ?

## 2021-12-25 NOTE — ED Provider Triage Note (Signed)
Emergency Medicine Provider Triage Evaluation Note ? ?[name] , a 31 y.o. male  was evaluated in triage.  Pt complains of smoking weed, drinking alcohol, and energy drinks 2 hours ago.  He states since then he has been feeling anxious, having chest tightness, short of breath.  He states this is happened a couple times before after smoking.  He is without other complaints.  He does appear to be anxious on exam.  He is anxious and requesting to have basic work-up done. ? ?Review of Systems  ?Positive: As above ?Negative: As above ? ?Physical Exam  ?BP (!) 137/100 (BP Location: Right Arm)   Pulse (!) 115   Temp 98.1 ?F (36.7 ?C) (Oral)   Resp (!) 22   Ht 5\' 5"  (1.651 m)   Wt 70.3 kg   SpO2 96%   BMI 25.79 kg/m?  ?Gen:   Awake, no distress   ?Resp:  Normal effort  ?MSK:   Moves extremities without difficulty ?Other:   ? ?Medical Decision Making  ?Medically screening exam initiated at 7:34 PM.  Appropriate orders placed.  was informed that the remainder of the evaluation will be completed by another provider, this initial triage assessment does not replace that evaluation, and the importance of remaining in the ED until their evaluation is complete. ? ? ?  ?[name], PA-C ?12/25/21 1935 ? ?

## 2021-12-31 IMAGING — US US SCROTUM W/ DOPPLER COMPLETE
1 series · 15 of 25 positions shown · non-contrast
Comparison: None.

CLINICAL DATA: Testicular pain

EXAM:
SCROTAL ULTRASOUND
DOPPLER ULTRASOUND OF THE TESTICLES
TECHNIQUE: Complete ultrasound examination of the testicles, epididymis, and
other scrotal structures was performed. Color and spectral Doppler
ultrasound were also utilized to evaluate blood flow to the
testicles.

[Series 1: us art/ven flow abd pelv doppl mc & wl · 15 of 53 slices shown]
[im 1/53]
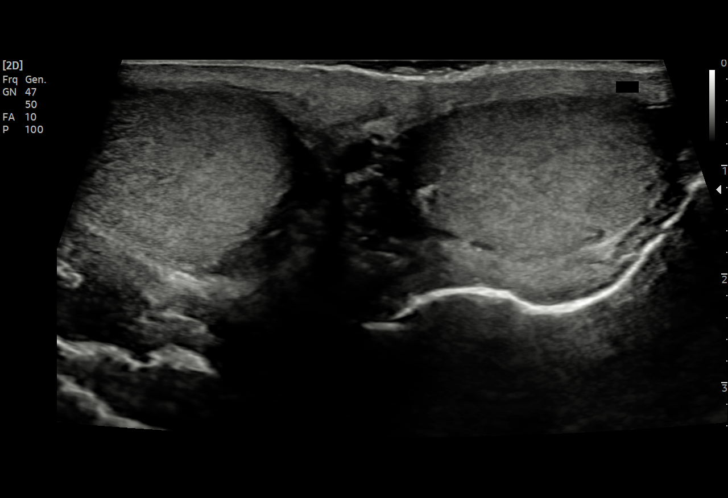
[im 5/53]
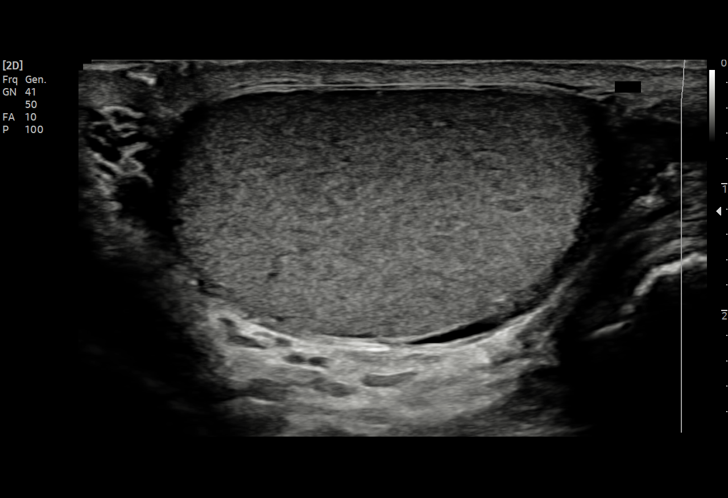
[im 9/53]
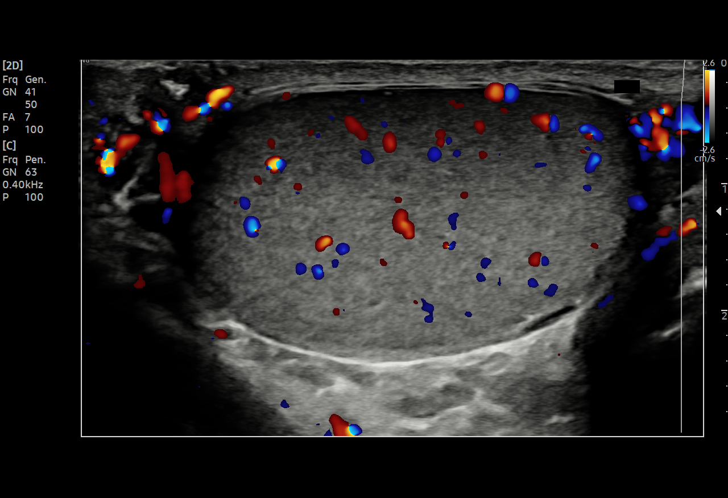
[im 11/53]
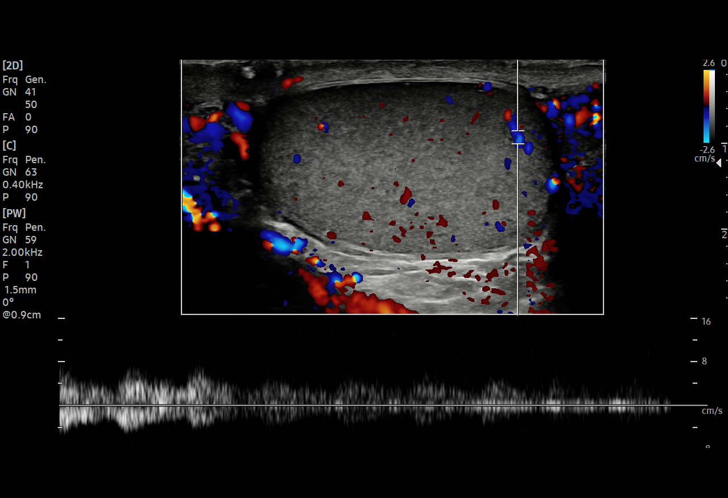
[im 16/53]
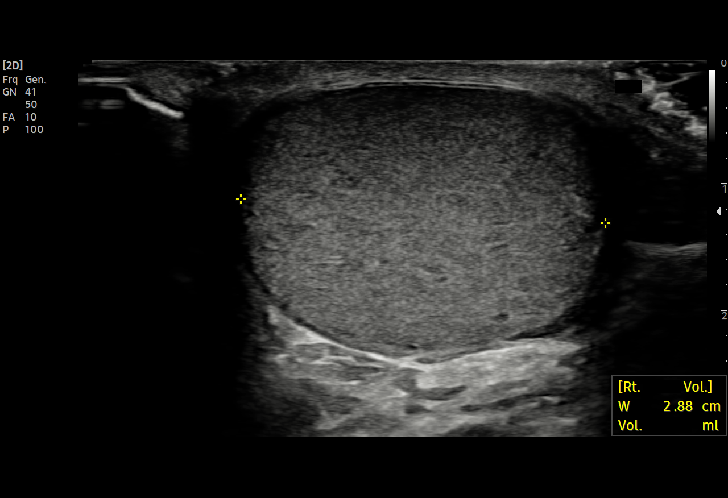
[im 20/53]
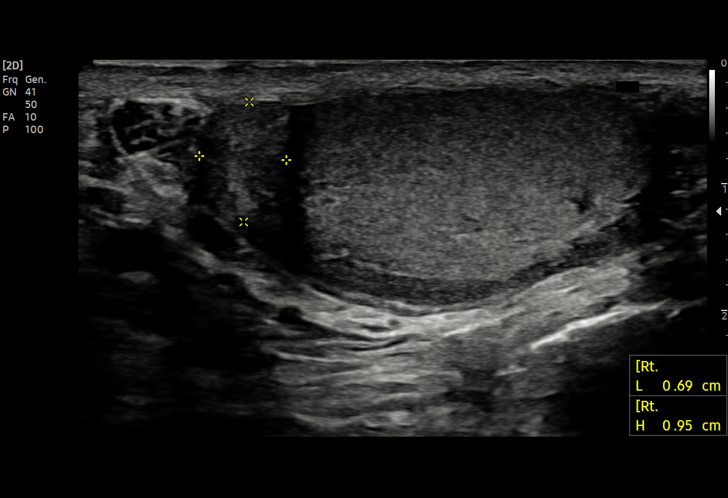
[im 22/53]
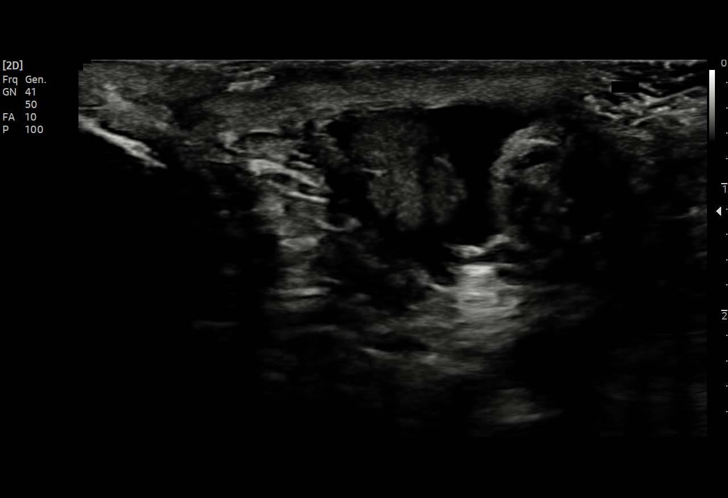
[im 27/53]
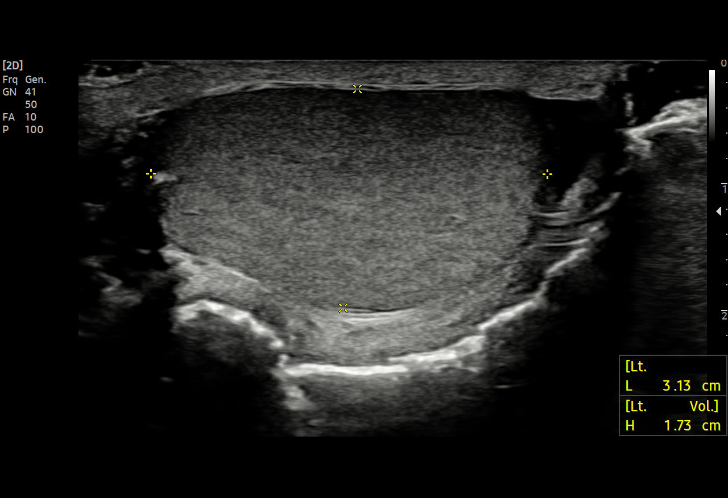
[im 31/53]
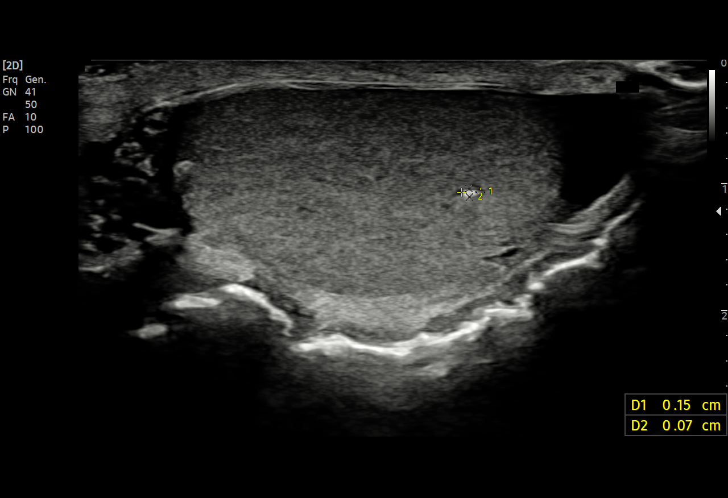
[im 33/53]
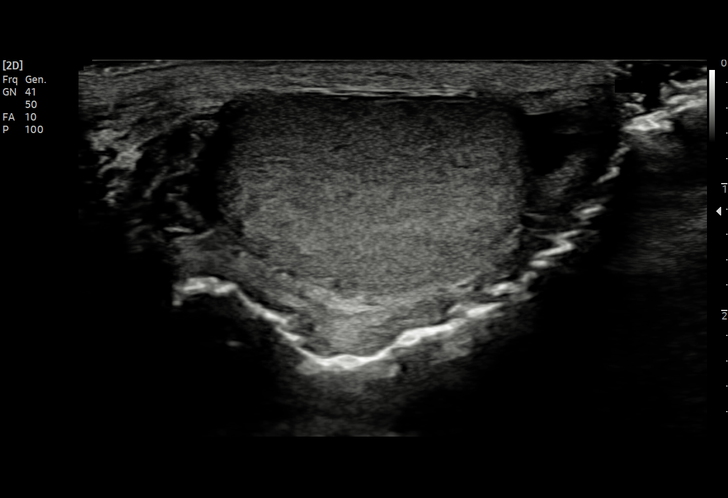
[im 37/53]
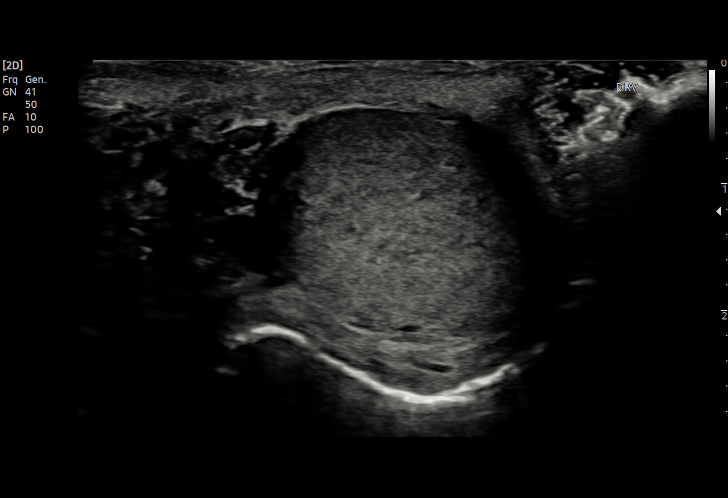
[im 42/53]
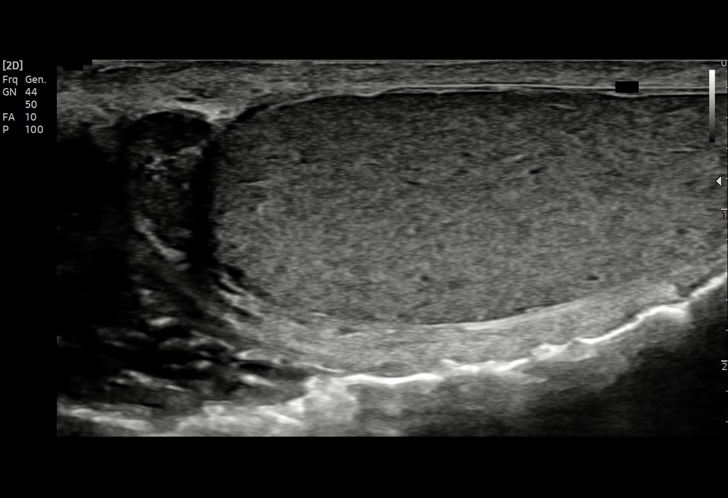
[im 44/53]
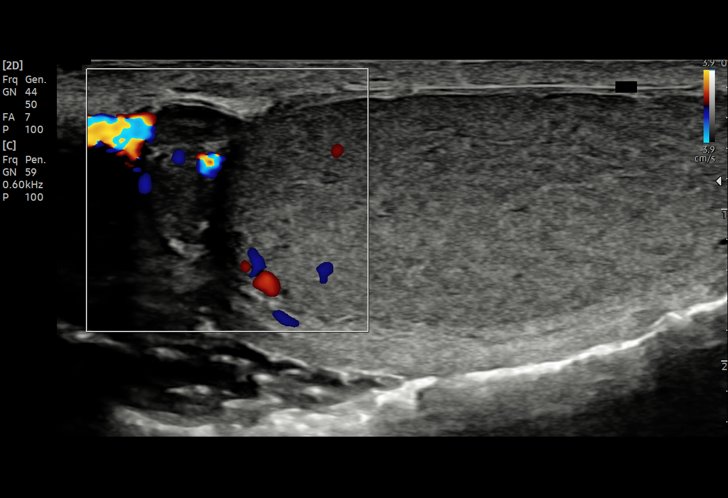
[im 48/53]
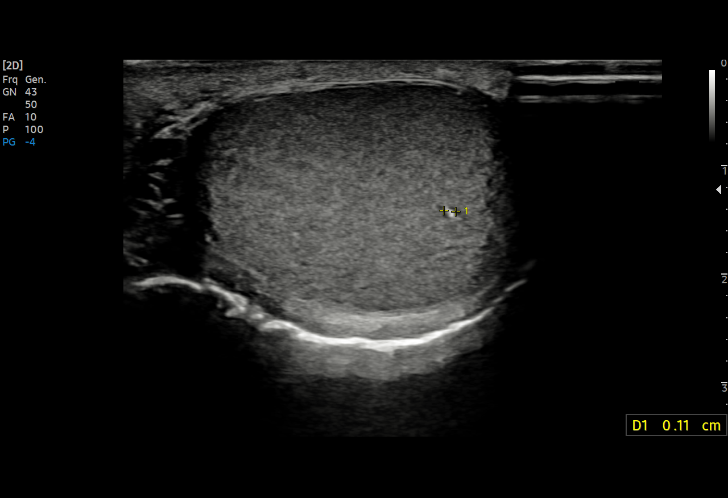
[im 53/53]
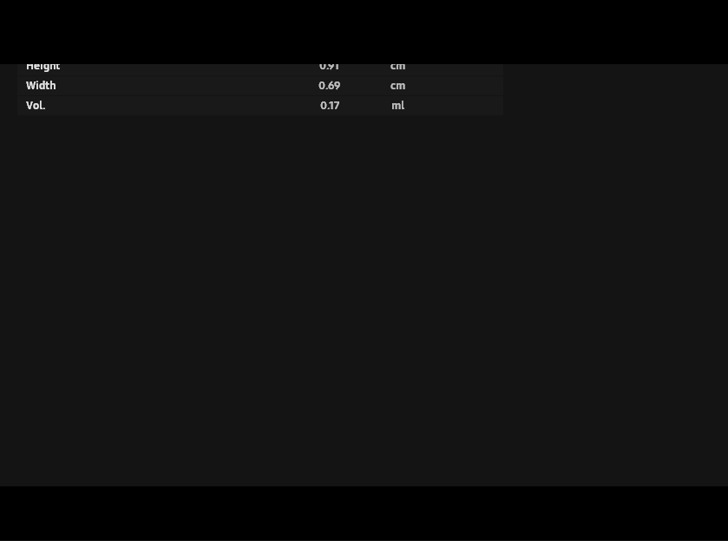

[15 of 25 positions shown; findings below may reference images not displayed]

FINDINGS: Right testicle

Measurements: 3.6 x 1.9 x 2.9 cm. No mass or microlithiasis
visualized.

Left testicle

Measurements: 3.1 x 1.7 x 2.7 cm. No mass. Nonspecific 2 mm
hyperechoic foci.

Right epididymis:  Normal in size and appearance.

Left epididymis:  Normal in size and appearance.

Hydrocele:  Bilateral small, left greater than right.

Varicocele:  None visualized.

Pulsed Doppler interrogation of both testes demonstrates normal low
resistance arterial and venous waveforms bilaterally.
IMPRESSION: Nonspecific left 2 mm hyperechoic foci.

## 2022-02-10 ENCOUNTER — Encounter (HOSPITAL_COMMUNITY): Payer: Self-pay

## 2022-02-10 ENCOUNTER — Other Ambulatory Visit: Payer: Self-pay

## 2022-02-10 ENCOUNTER — Emergency Department (HOSPITAL_COMMUNITY)
Admission: EM | Admit: 2022-02-10 | Discharge: 2022-02-10 | Disposition: A | Discharge status: Home / Self Care | Payer: BC Managed Care – PPO | Attending: Emergency Medicine | Admitting: Emergency Medicine

## 2022-02-10 DIAGNOSIS — R202 Paresthesia of skin: Secondary | ICD-10-CM | POA: Insufficient documentation

## 2022-02-10 DIAGNOSIS — M549 Dorsalgia, unspecified: Secondary | ICD-10-CM | POA: Diagnosis present

## 2022-02-10 DIAGNOSIS — M545 Low back pain, unspecified: Secondary | ICD-10-CM | POA: Insufficient documentation

## 2022-02-10 MED ORDER — METHOCARBAMOL 500 MG PO TABS: 500.0000 mg | ORAL_TABLET | Freq: Two times a day (BID) | ORAL | 0 refills | Status: DC

## 2022-02-10 NOTE — Discharge Instructions (Addendum)
Please use Tylenol or ibuprofen for pain.  You may use 600-800 mg ibuprofen every 6 hours or 1000 mg of Tylenol every 6 hours.  You may choose to alternate between the 2.  This would be most effective.  Not to exceed 4 g of Tylenol within 24 hours.  Not to exceed 3200 mg ibuprofen 24 hours. ? ?You can use the Robaxin I am prescribing up to twice daily.  Be cautious as this medication can make you drowsy.  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 ? ?You can apply some ice or heat directly to the area where it hurts.  You may benefit from chiropractic adjustment if you would like to try something.  I recommend that you do some rehab exercises as discussed in the attachments I provided. ? ?If you have worsening pain, new numbness, inability to urinate or defecate please return to the emergency department.  If your pain persists despite treatment I recommend that you follow-up with orthopedics for further evaluation. ?

## 2022-02-10 NOTE — ED Provider Notes (Signed)
?[facility]-EMERGENCY DEPT ?Provider Note ? ? ?CSN: [id number] ?Arrival date & time: 02/10/22  1351 ? ?  ? ?History ? ?Chief Complaint  ?Patient presents with  ? Back Pain  ? ? ?Dennis Matthews is a 31 y.o. male with noncontributory past medical history presents with concern for acute on chronic right-sided back pain.  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  Denies history of IV drug use, chronic corticosteroid use, history of cancer, recent fever, or back pain waking patient up from sleep.  He reports that he has tried some Tylenol 250 mg with minimal relief.  He reports that he tried ibuprofen in the past but it makes his chest hurt. ? ? ?Back Pain ? ?  ? ?Home Medications ?Prior to Admission medications   ?Medication Sig Start Date End Date Taking? Authorizing Provider  ?methocarbamol (ROBAXIN) 500 MG tablet Take 1 tablet (500 mg total) by mouth 2 (two) times daily. 02/10/22  Yes Camela Wich H, PA-C  ?acetaminophen (TYLENOL) 325 MG tablet Take 2 tablets (650 mg total) by mouth every 6 (six) hours as needed for up to 30 doses for mild pain or moderate pain. 03/16/21   Terald Sleeper, MD  ?oxyCODONE-acetaminophen (PERCOCET/ROXICET) 5-325 MG tablet Take 1-2 tablets by mouth every 4 (four) hours as needed for severe pain. 12/27/19   Lorre Nick, MD  ?tamsulosin (FLOMAX) 0.4 MG CAPS capsule Take 1 capsule (0.4 mg total) by mouth daily. 12/27/19   Lorre Nick, MD  ?   ? ?Allergies    ?Nsaids   ? ?Review of Systems   ?Review of Systems  ?Musculoskeletal:  Positive for back pain.  ?All other systems reviewed and are negative. ? ?Physical Exam ?Updated Vital Signs ?BP 132/77   Pulse 77   Temp 98.2 ?F (36.8 ?C) (Oral)   Resp 18   SpO2 98%  ?Physical Exam ?Vitals and nursing note reviewed.  ?Constitutional:   ?   General: He is not in acute  distress. ?   Appearance: Normal appearance.  ?HENT:  ?   Head: Normocephalic and atraumatic.  ?Eyes:  ?   General:     ?   Right eye: No discharge.     ?   Left eye: No discharge.  ?Cardiovascular:  ?   Rate and Rhythm: Normal rate and regular rhythm.  ?Pulmonary:  ?   Effort: Pulmonary effort is normal. No respiratory distress.  ?Musculoskeletal:     ?   General: No deformity.  ?   Comments: Patient with some tenderness to palpation in the paraspinous medical Sowles of the right lumbar back.  He has no tenderness to palpation midline spine.  He is intact strength 5 out of 5 bilateral upper and lower extremities.  ?Skin: ?   General: Skin is warm and dry.  ?Neurological:  ?   Mental Status: He is alert and oriented to person, place, and time.  ?Psychiatric:     ?   Mood and Affect: Mood normal.     ?   Behavior: Behavior normal.  ? ? ?ED Results / Procedures / Treatments   ?Labs ?(all labs ordered are listed, but only abnormal results are displayed) ?Labs Reviewed - No data to display ? ?EKG ?None ? ?Radiology ?No results found. ? ?Procedures ?Procedures  ? ? ?Medications Ordered in ED ?Medications - No data  to display ? ?ED Course/ Medical Decision Making/ A&P ?  ?                        ?Medical Decision Making ?Risk ?Prescription drug management. ? ? ?Patient with right sided back pain.  My emergent differential diagnosis includes slipped disc, compression fracture, spondylolisthesis, less clinical concern for epidural abscess or osteomyelitis based on patient history.  No neurological deficits. Patient is ambulatory. No warning symptoms of back pain including: fecal incontinence, urinary retention or overflow incontinence, night sweats, waking from sleep with back pain, unexplained fevers or weight loss, h/o cancer, IVDU, recent trauma. No concern for cauda equina, epidural abscess, or other serious cause of back pain.  Given this work-up, evaluation, physical exam I do not believe that radiographic imaging is  indicated at this time.  Conservative measures such as rest, ice/heat, ibuprofen, Tylenol, and  prescription for Robaxin indicated with orthopedic follow-up if no improvement with conservative management.  Extensive return precautions given, patient discharged in stable condition at this time. ? ?Final Clinical Impression(s) / ED Diagnoses ?Final diagnoses:  ?Acute right-sided low back pain without sciatica  ? ? ?Rx / DC Orders ?ED Discharge Orders   ? ?      Ordered  ?  methocarbamol (ROBAXIN) 500 MG tablet  2 times daily       ? 02/10/22 1431  ? ?  ?  ? ?  ? ? ?  ?Olene Floss, PA-C ?02/10/22 1433 ? ?  ?Glynn Octave, MD ?02/10/22 1619 ? ?

## 2022-02-10 NOTE — ED Notes (Signed)
An After Visit Summary was printed and given to the patient. Discharge instructions given and no further questions at this time.  

## 2022-02-10 NOTE — ED Triage Notes (Signed)
Pt c/o chronic lower right back pain. Pt states he has been seen for this and was told it might be sciatica but pt says "I think I need a MRI". Pt ambulatory.  ?

## 2022-02-24 ENCOUNTER — Ambulatory Visit (INDEPENDENT_AMBULATORY_CARE_PROVIDER_SITE_OTHER): Payer: BC Managed Care – PPO | Admitting: Primary Care

## 2022-02-24 ENCOUNTER — Other Ambulatory Visit (HOSPITAL_COMMUNITY)
Admission: RE | Admit: 2022-02-24 | Discharge: 2022-02-24 | Disposition: A | Discharge status: Home / Self Care | Payer: BC Managed Care – PPO | Source: Ambulatory Visit | Attending: Primary Care | Admitting: Primary Care

## 2022-02-24 ENCOUNTER — Encounter (INDEPENDENT_AMBULATORY_CARE_PROVIDER_SITE_OTHER): Payer: Self-pay | Admitting: Primary Care

## 2022-02-24 VITALS — BP 130/84 | HR 77 | Temp 98.4°F | Ht 65.0 in | Wt 185.2 lb

## 2022-02-24 DIAGNOSIS — Z113 Encounter for screening for infections with a predominantly sexual mode of transmission: Secondary | ICD-10-CM

## 2022-02-24 DIAGNOSIS — R350 Frequency of micturition: Secondary | ICD-10-CM | POA: Diagnosis not present

## 2022-02-24 DIAGNOSIS — M5440 Lumbago with sciatica, unspecified side: Secondary | ICD-10-CM | POA: Diagnosis not present

## 2022-02-24 DIAGNOSIS — Z23 Encounter for immunization: Secondary | ICD-10-CM

## 2022-02-24 DIAGNOSIS — Z7689 Persons encountering health services in other specified circumstances: Secondary | ICD-10-CM | POA: Diagnosis not present

## 2022-02-24 LAB — POCT URINALYSIS DIP (CLINITEK)
Bilirubin, UA: NEGATIVE
Blood, UA: NEGATIVE
Glucose, UA: NEGATIVE mg/dL
Ketones, POC UA: NEGATIVE mg/dL
Leukocytes, UA: NEGATIVE
Nitrite, UA: NEGATIVE
POC PROTEIN,UA: NEGATIVE
Spec Grav, UA: <=1.005 — AB (ref 1.010–1.025)
Urobilinogen, UA: 0.2 E.U./dL
pH, UA: 6 (ref 5.0–8.0)

## 2022-02-24 NOTE — Patient Instructions (Addendum)
Tdap (Tetanus, Diphtheria, Pertussis) Vaccine: What You Need to Know 1. Why get vaccinated? Tdap vaccine can prevent tetanus, diphtheria, and pertussis. Diphtheria and pertussis spread from person to person. Tetanus enters the body through cuts or wounds. TETANUS (T) causes painful stiffening of the muscles. Tetanus can lead to serious health problems, including being unable to open the mouth, having trouble swallowing and breathing, or death. DIPHTHERIA (D) can lead to difficulty breathing, heart failure, paralysis, or death. PERTUSSIS (aP), also known as "whooping cough," can cause uncontrollable, violent coughing that makes it hard to breathe, eat, or drink. Pertussis can be extremely serious especially in babies and young children, causing pneumonia, convulsions, brain damage, or death. In teens and adults, it can cause weight loss, loss of bladder control, passing out, and rib fractures from severe coughing. 2. Tdap vaccine Tdap is only for children 7 years and older, adolescents, and adults.  Adolescents should receive a single dose of Tdap, preferably at age 10 or 71 years. Pregnant people should get a dose of Tdap during every pregnancy, preferably during the early part of the third trimester, to help protect the newborn from pertussis. Infants are most at risk for severe, life-threatening complications from pertussis. Adults who have never received Tdap should get a dose of Tdap. Also, adults should receive a booster dose of either Tdap or Td (a different vaccine that protects against tetanus and diphtheria but not pertussis) every 10 years, or after 5 years in the case of a severe or dirty wound or burn. Tdap may be given at the same time as other vaccines. 3. Talk with your health care provider Tell your vaccine provider if the person getting the vaccine: Has had an allergic reaction after a previous dose of any vaccine that protects against tetanus, diphtheria, or pertussis, or has any  severe, life-threatening allergies Has had a coma, decreased level of consciousness, or prolonged seizures within 7 days after a previous dose of any pertussis vaccine (DTP, DTaP, or Tdap) Has seizures or another nervous system problem Has ever had Guillain-Barr Syndrome (also called "GBS") Has had severe pain or swelling after a previous dose of any vaccine that protects against tetanus or diphtheria In some cases, your health care provider may decide to postpone Tdap vaccination until a future visit. People with minor illnesses, such as a cold, may be vaccinated. People who are moderately or severely ill should usually wait until they recover before getting Tdap vaccine.  Your health care provider can give you more information. 4. Risks of a vaccine reaction Pain, redness, or swelling where the shot was given, mild fever, headache, feeling tired, and nausea, vomiting, diarrhea, or stomachache sometimes happen after Tdap vaccination. People sometimes faint after medical procedures, including vaccination. Tell your provider if you feel dizzy or have vision changes or ringing in the ears.  As with any medicine, there is a very remote chance of a vaccine causing a severe allergic reaction, other serious injury, or death. 5. What if there is a serious problem? An allergic reaction could occur after the vaccinated person leaves the clinic. If you see signs of a severe allergic reaction (hives, swelling of the face and throat, difficulty breathing, a fast heartbeat, dizziness, or weakness), call 9-1-1 and get the person to the nearest hospital. For other signs that concern you, call your health care provider.  Adverse reactions should be reported to the Vaccine Adverse Event Reporting System (VAERS). Your health care provider will usually file this report, or you  can do it yourself. Visit the VAERS website at www.vaers.LAgents.no or call (860)490-2447. VAERS is only for reporting reactions, and VAERS staff  members do not give medical advice. 6. The National Vaccine Injury Compensation Program The [employer] Vaccine Injury Compensation Program (VICP) is a federal program that was created to compensate people who may have been injured by certain vaccines. Claims regarding alleged injury or death due to vaccination have a time limit for filing, which may be as short as two years. Visit the VICP website at SpiritualWord.at or call 303-061-0512 to learn about the program and about filing a claim. 7. How can I learn more? Ask your health care provider. Call your local or state health department. Visit the website of the Food and Drug Administration (FDA) for vaccine package inserts and additional information at FinderList.no. Contact the Centers for Disease Control and Prevention (CDC): Call (762)219-1905 (1-800-CDC-INFO) or Visit CDC's website at PicCapture.uy. Source: CDC Vaccine Information Statement Tdap (Tetanus, Diphtheria, Pertussis) Vaccine (05/08/2020) This same material is available at FootballExhibition.com.br for no charge. This information is not intended to replace advice given to you by your health care provider. Make sure you discuss any questions you have with your health care provider. Document Revised: 08/18/2021 Document Reviewed: 06/21/2021 Elsevier Patient Education  2023 Elsevier Inc.  Lift upwards following your head and shoulders. Hold the load close to your body. Lift by extending your legs with your back straight, your buttocks out, and breathe out as you lift. If you are doing this lift correctly, your head will lift up first, followed by your straight back.

## 2022-02-24 NOTE — Progress Notes (Signed)
Established Patient Office Visit  Subjective   Patient ID: Dennis Matthews, male    DOB: Sep 21, 1991  Age: 31 y.o. MRN: [id number]  Chief Complaint  Patient presents with  . New Patient (Initial Visit)    Complains of back pain that radiates to both sides especially after work. He lifts boxes at [place]. Also states that when he gets nervous he has pain in his groin    HPI  Mr Dennis Matthews. Osegueda is a 31 year old male in today to establish care.  He voices concerns of back pain that radiates.  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  He also states that he gets nervous and hurts in his groin area. Patient has No headache, No chest pain, No abdominal pain - No Nausea, No new weakness tingling or numbness, No Cough - shortness of breath  ROS Comprehensive ROS Pertinent positive and negative noted in HPI     Objective:     BP 130/84   Pulse 77   Temp 98.4 F (36.9 C) (Oral)   Ht 5\' 5"  (1.651 m)   Wt 185 lb 3.2 oz (84 kg)   SpO2 95%   BMI 30.82 kg/m   Physical Exam Vitals reviewed.  HENT:     Head: Normocephalic.     Right Ear: Tympanic membrane and external ear normal.     Left Ear: Tympanic membrane and external ear normal.     Nose: Nose normal.  Eyes:     Extraocular Movements: Extraocular movements intact.     Conjunctiva/sclera: Conjunctivae normal.     Pupils: Pupils are equal, round, and reactive to light.  Cardiovascular:     Rate and Rhythm: Normal rate and regular rhythm.  Pulmonary:     Effort: Pulmonary effort is normal.     Breath sounds: Normal breath sounds.  Abdominal:     General: Bowel sounds are normal.     Palpations: Abdomen is soft.  Musculoskeletal:     Cervical back: Normal range of motion.  Skin:    General: Skin is warm and dry.  Neurological:     Mental Status: He is alert.  Psychiatric:        Mood and Affect: Mood normal.        Behavior: Behavior normal.        Thought Content: Thought content normal.         Judgment: Judgment normal.    Results for orders placed or performed in visit on 02/24/22  POCT URINALYSIS DIP (CLINITEK)  Result Value Ref Range   Color, UA yellow yellow   Clarity, UA clear clear   Glucose, UA negative negative mg/dL   Bilirubin, UA negative negative   Ketones, POC UA negative negative mg/dL   Spec Grav, UA <=1.005 (A) 1.010 - 1.025   Blood, UA negative negative   pH, UA 6.0 5.0 - 8.0   POC PROTEIN,UA negative negative, trace   Urobilinogen, UA 0.2 0.2 or 1.0 E.U./dL   Nitrite, UA Negative Negative   Leukocytes, UA Negative Negative  Cytology (oral, anal, urethral) ancillary only  Result Value Ref Range   Neisseria Gonorrhea Negative    Chlamydia Negative    Trichomonas Negative    Comment Normal Reference Range Trichomonas - Negative    Comment Normal Reference Ranger Chlamydia - Negative    Comment      Normal Reference Range Neisseria Gonorrhea - Negative    The ASCVD Risk score (Arnett DK,  et al., 2019) failed to calculate for the following reasons:   The 2019 ASCVD risk score is only valid for ages 35 to 67    Assessment & Plan:  [name] was seen today for new patient (initial visit).  Diagnoses and all orders for this visit:  Need for Tdap vaccination -     Tdap vaccine greater than or equal to 7yo IM  Acute midline low back pain with sciatica, sciatica laterality unspecified Repetitive motion from work demonstrated proper body mechanics and return demonstration  Encounter to establish care Establish care with PCP  Screening for STD (sexually transmitted disease) -     Cytology (oral, anal, urethral) ancillary only -     Cancel: HIV Antibody (routine testing w rflx)  Urinary frequency -     POCT URINALYSIS DIP (CLINITEK)    Return if symptoms worsen or fail to improve.    Dennis Perna, NP

## 2022-03-01 LAB — CYTOLOGY, (ORAL, ANAL, URETHRAL) ANCILLARY ONLY
Chlamydia: NEGATIVE
Comment: NEGATIVE
Comment: NEGATIVE
Comment: NORMAL
Neisseria Gonorrhea: NEGATIVE
Trichomonas: NEGATIVE

## 2022-03-03 ENCOUNTER — Ambulatory Visit: Payer: BC Managed Care – PPO | Admitting: Physician Assistant

## 2022-04-12 ENCOUNTER — Ambulatory Visit (INDEPENDENT_AMBULATORY_CARE_PROVIDER_SITE_OTHER): Payer: BC Managed Care – PPO | Admitting: Primary Care

## 2022-10-16 IMAGING — CR DG CHEST 2V
2 series · 2 of 2 positions shown · non-contrast
Comparison: 07/28/2011

CLINICAL DATA: Shortness of breath, marijuana use, anxiety

EXAM:
CHEST - 2 VIEW

[w chest pa]
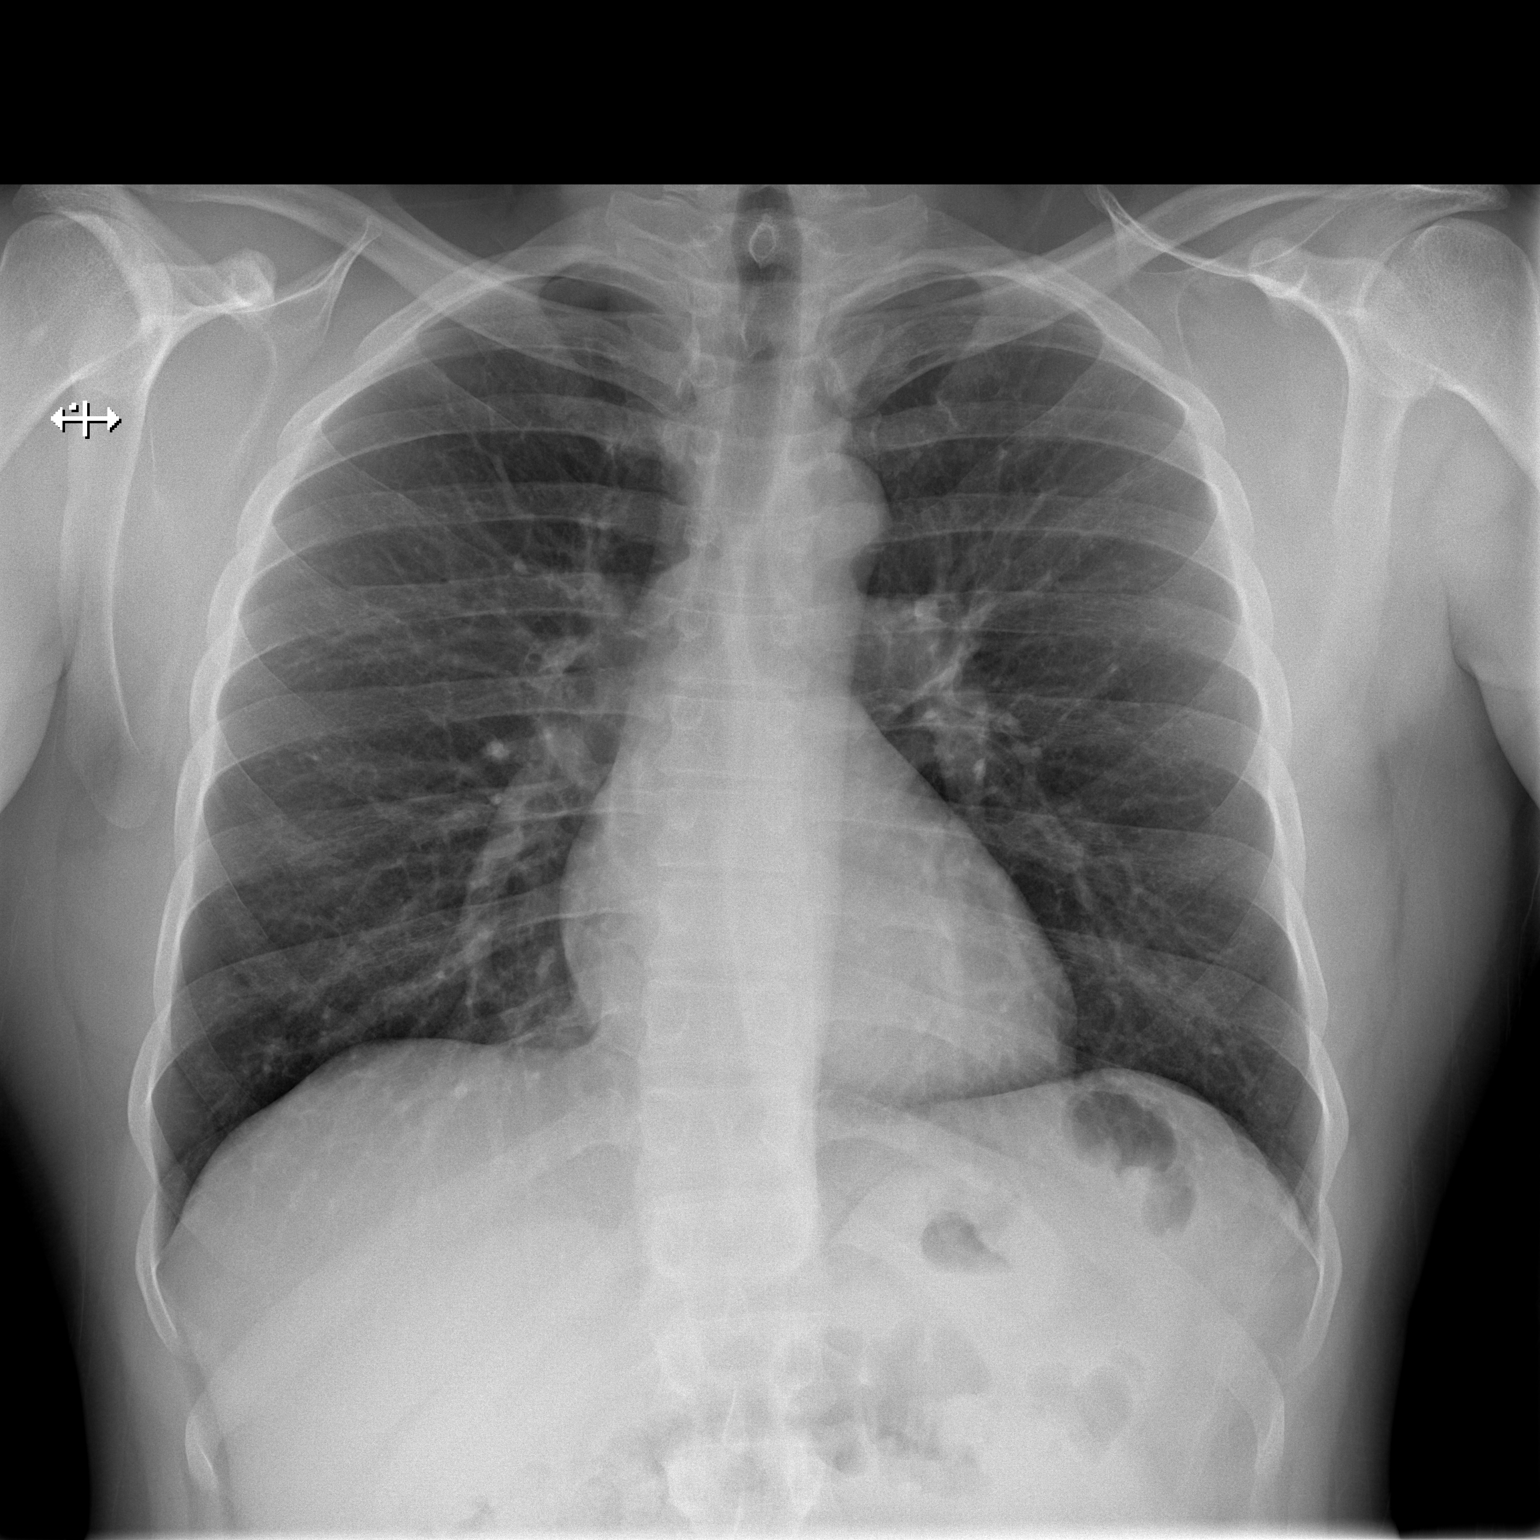

[w chest lat]
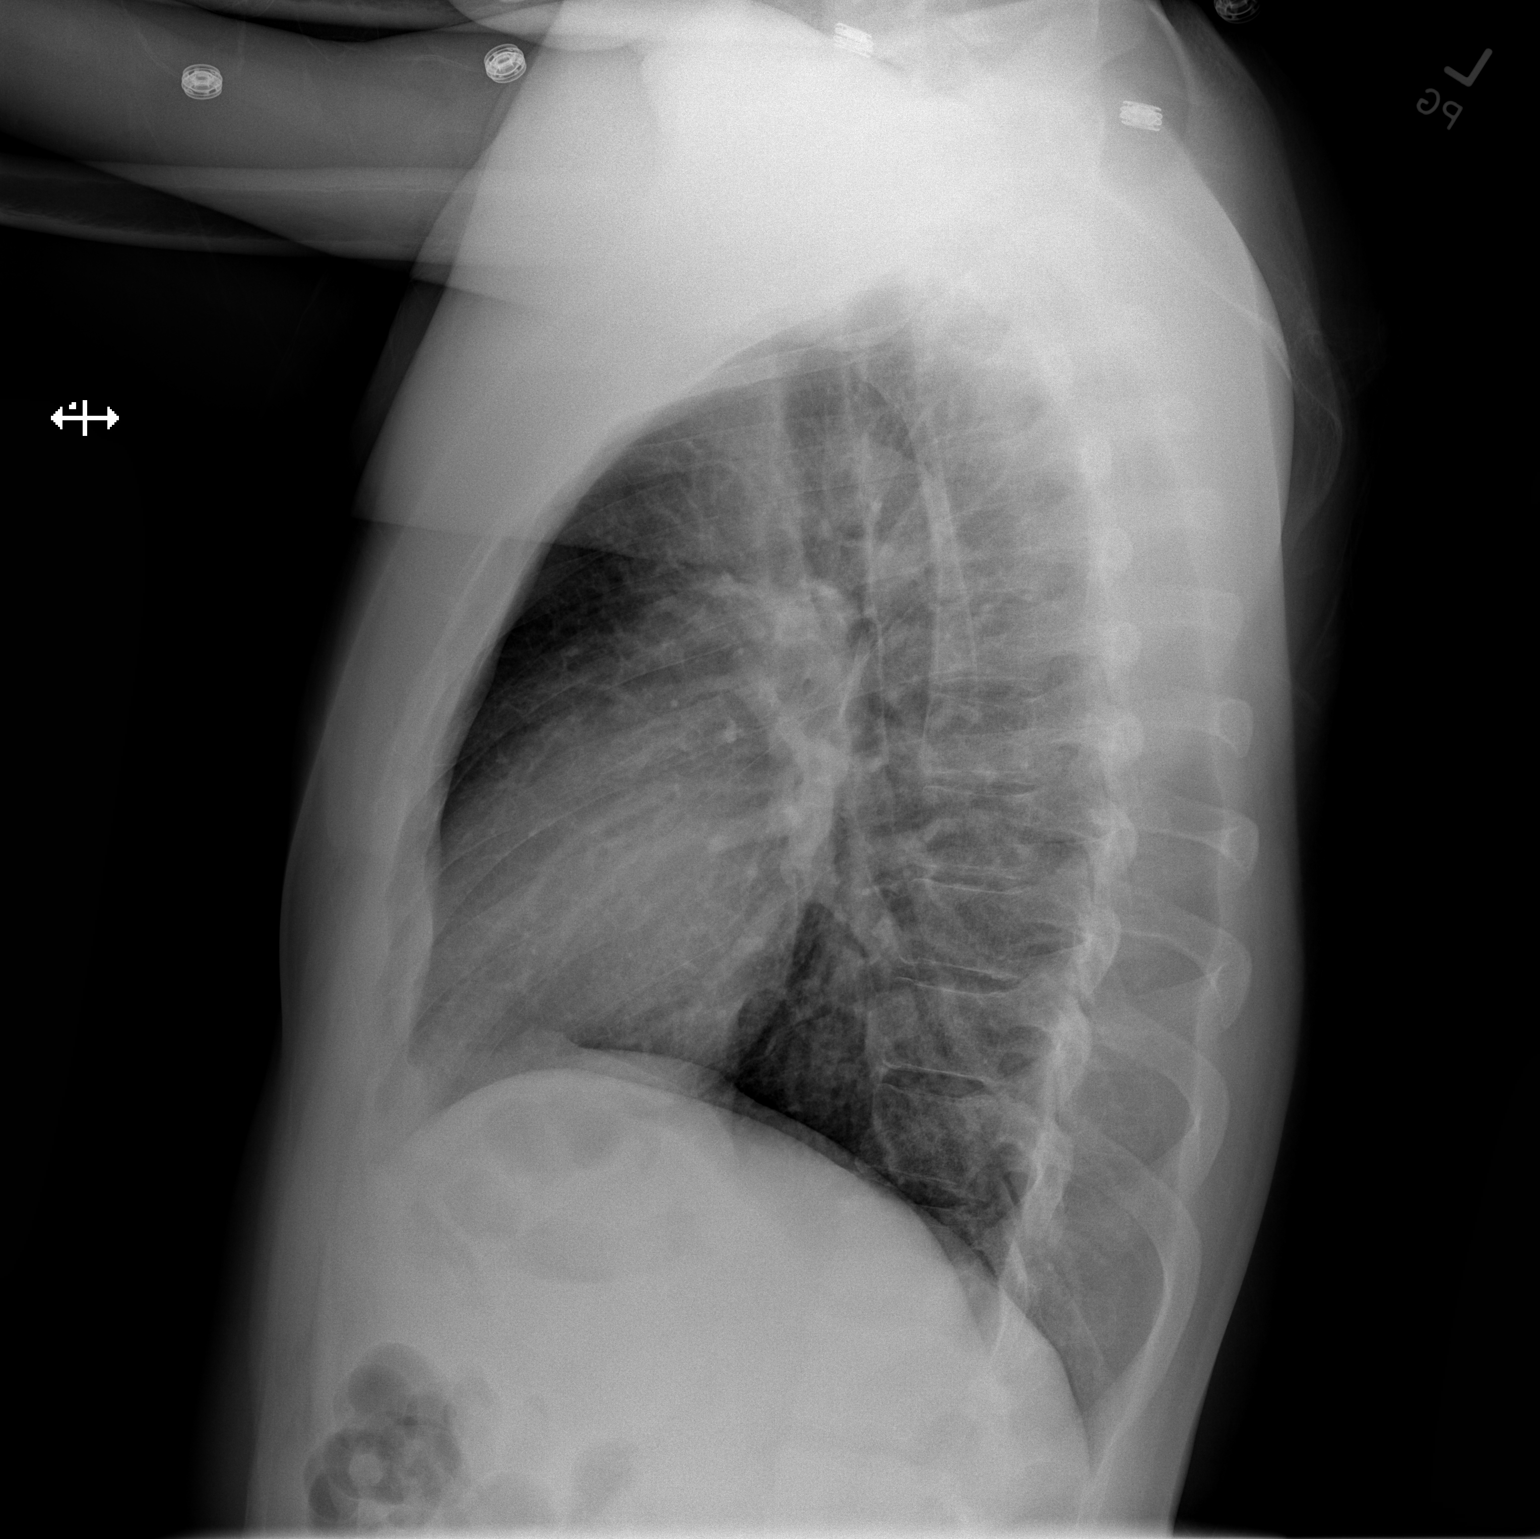

[2 of 2 positions shown; findings below may reference images not displayed]

FINDINGS: The heart size and mediastinal contours are within normal limits.
Both lungs are clear. The visualized skeletal structures are
unremarkable.
IMPRESSION: No active cardiopulmonary disease.

## 2022-12-15 ENCOUNTER — Ambulatory Visit
Admission: EM | Admit: 2022-12-15 | Discharge: 2022-12-15 | Disposition: A | Discharge status: Home / Self Care | Payer: BC Managed Care – PPO | Attending: Internal Medicine | Admitting: Internal Medicine

## 2022-12-15 DIAGNOSIS — R8290 Unspecified abnormal findings in urine: Secondary | ICD-10-CM

## 2022-12-15 DIAGNOSIS — R350 Frequency of micturition: Secondary | ICD-10-CM

## 2022-12-15 DIAGNOSIS — Z113 Encounter for screening for infections with a predominantly sexual mode of transmission: Secondary | ICD-10-CM | POA: Insufficient documentation

## 2022-12-15 LAB — POCT URINALYSIS DIP (MANUAL ENTRY)
Bilirubin, UA: NEGATIVE
Blood, UA: NEGATIVE
Glucose, UA: NEGATIVE mg/dL
Ketones, POC UA: NEGATIVE mg/dL
Leukocytes, UA: NEGATIVE
Nitrite, UA: NEGATIVE
Protein Ur, POC: NEGATIVE mg/dL
Spec Grav, UA: 1.02 (ref 1.010–1.025)
Urobilinogen, UA: 0.2 E.U./dL
pH, UA: 6.5 (ref 5.0–8.0)

## 2022-12-15 NOTE — ED Triage Notes (Signed)
Pt states he has been having urinary urgency,frequency and foul smelling urine on and off for a month.  Also states he would like to be tested for STD's denies any symptoms.

## 2022-12-15 NOTE — Discharge Instructions (Signed)
Urine was clear.  Drink plenty of water.  STD testing is pending.  Will call if it is abnormal.  Please follow-up with urology at provided contact information for further evaluation and management.  Refrain from sexual activity until test results and treatment are complete if treatment is necessary.

## 2022-12-15 NOTE — ED Provider Notes (Signed)
[facility]    CSN: [id number] Arrival date & time: 12/15/22  1519      History   Chief Complaint Chief Complaint  Patient presents with   Urinary Frequency    HPI Dennis Matthews is a 32 y.o. male.   Patient presents with urinary frequency and malodorous urine that has been intermittent for the past few months.  Patient denies dysuria, hematuria, penile discharge, testicular pain, abdominal pain, fever.  He does report back pain but reports this is baseline given his job.  Patient is sexually active and has had unprotected sexual intercourse but denies exposure to STD.  Patient reports he has been seen for the symptoms several times but nobody has found any abnormalities.  Patient would like to be tested for STDs today as well.   Urinary Frequency    Past Medical History:  Diagnosis Date   Renal disorder     There are no problems to display for this patient.   History reviewed. No pertinent surgical history.     Home Medications    Prior to Admission medications   Medication Sig Start Date End Date Taking? Authorizing Provider  methocarbamol (ROBAXIN) 500 MG tablet Take 1 tablet (500 mg total) by mouth 2 (two) times daily. 02/10/22   Prosperi, Joesph Fillers, PA-C    Family History History reviewed. No pertinent family history.  Social History Social History   Tobacco Use   Smoking status: Never   Smokeless tobacco: Never  Vaping Use   Vaping Use: Never used  Substance Use Topics   Alcohol use: Yes   Drug use: Not Currently    Types: Marijuana     Allergies   Nsaids   Review of Systems Review of Systems Per HPI  Physical Exam Triage Vital Signs ED Triage Vitals  Enc Vitals Group     BP 12/15/22 1536 (!) 144/86     Pulse Rate 12/15/22 1536 72     Resp 12/15/22 1536 16     Temp 12/15/22 1536 97.8 F (36.6 C)     Temp Source 12/15/22 1536 Oral     SpO2 12/15/22 1536 95 %     Weight --      Height --      Head Circumference  --      Peak Flow --      Pain Score 12/15/22 1538 0     Pain Loc --      Pain Edu? --      Excl. in [place]? --    No data found.  Updated Vital Signs BP (!) 144/86 (BP Location: Right Arm)   Pulse 72   Temp 97.8 F (36.6 C) (Oral)   Resp 16   SpO2 95%   Visual Acuity Right Eye Distance:   Left Eye Distance:   Bilateral Distance:    Right Eye Near:   Left Eye Near:    Bilateral Near:     Physical Exam Constitutional:      General: He is not in acute distress.    Appearance: Normal appearance. He is not toxic-appearing or diaphoretic.  HENT:     Head: Normocephalic and atraumatic.  Eyes:     Extraocular Movements: Extraocular movements intact.     Conjunctiva/sclera: Conjunctivae normal.  Pulmonary:     Effort: Pulmonary effort is normal.  Genitourinary:    Comments: Deferred with shared decision making. Self swab performed.  Neurological:     General: No focal deficit present.  Mental Status: He is alert and oriented to person, place, and time. Mental status is at baseline.  Psychiatric:        Mood and Affect: Mood normal.        Behavior: Behavior normal.        Thought Content: Thought content normal.        Judgment: Judgment normal.      UC Treatments / Results  Labs (all labs ordered are listed, but only abnormal results are displayed) Labs Reviewed  RPR  HIV ANTIBODY (ROUTINE TESTING W REFLEX)  POCT URINALYSIS DIP (MANUAL ENTRY)  CYTOLOGY, (ORAL, ANAL, URETHRAL) ANCILLARY ONLY    EKG   Radiology No results found.  Procedures Procedures (including critical care time)  Medications Ordered in UC Medications - No data to display  Initial Impression / Assessment and Plan / UC Course  I have reviewed the triage vital signs and the nursing notes.  Pertinent labs & imaging results that were available during my care of the patient were reviewed by me and considered in my medical decision making (see chart for details).     UA completely  unremarkable.  Awaiting cytology swab.  Advised patient to ensure adequate fluid hydration.  Given symptoms have been intermittent for the past few months, I do think that urology evaluation is necessary.  Provided patient with contact information for urology to follow-up.  Advised strict return precautions.  Patient verbalized understanding and was agreeable with plan. Final Clinical Impressions(s) / UC Diagnoses   Final diagnoses:  Urinary frequency  Malodorous urine  Screening examination for venereal disease     Discharge Instructions      Urine was clear.  Drink plenty of water.  STD testing is pending.  Will call if it is abnormal.  Please follow-up with urology at provided contact information for further evaluation and management.  Refrain from sexual activity until test results and treatment are complete if treatment is necessary.     ED Prescriptions   None    PDMP not reviewed this encounter.   Teodora Medici, [place] 12/15/22 1600

## 2022-12-16 LAB — CYTOLOGY, (ORAL, ANAL, URETHRAL) ANCILLARY ONLY
Chlamydia: NEGATIVE
Comment: NEGATIVE
Comment: NEGATIVE
Comment: NORMAL
Neisseria Gonorrhea: NEGATIVE
Trichomonas: NEGATIVE

## 2022-12-16 LAB — RPR: RPR Ser Ql: NONREACTIVE

## 2022-12-16 LAB — HIV ANTIBODY (ROUTINE TESTING W REFLEX): HIV Screen 4th Generation wRfx: NONREACTIVE

## 2022-12-26 ENCOUNTER — Telehealth: Payer: Self-pay | Admitting: Primary Care

## 2022-12-26 NOTE — Telephone Encounter (Signed)
Please contact pt and schedule a hospital follow up

## 2022-12-26 NOTE — Telephone Encounter (Signed)
Copied from [place] [id number]. Topic: General - Other >> Dec 26, 2022  3:11 PM Yolanda T wrote: Reason for CRM: Patient was seen in the ED on 3/14 and has kept getting calls about a f/u visit. Patient needs to be seen as soon as possible. Please contact him to see when he can come in as the earliest the providers has is 01/13/23.

## 2023-09-21 ENCOUNTER — Ambulatory Visit
Admission: EM | Admit: 2023-09-21 | Discharge: 2023-09-21 | Disposition: A | Discharge status: Home / Self Care | Payer: BC Managed Care – PPO | Attending: Family Medicine | Admitting: Family Medicine

## 2023-09-21 DIAGNOSIS — M62830 Muscle spasm of back: Secondary | ICD-10-CM

## 2023-09-21 DIAGNOSIS — M546 Pain in thoracic spine: Secondary | ICD-10-CM

## 2023-09-21 MED ORDER — METHOCARBAMOL 1000 MG PO TABS: 500.0000 mg | ORAL_TABLET | Freq: Two times a day (BID) | ORAL | 0 refills | Status: DC

## 2023-09-21 NOTE — ED Provider Notes (Signed)
[facility]   [id number] 09/21/23 Arrival Time: 0943  ASSESSMENT & PLAN:  1. Acute left-sided thoracic back pain   2. Muscle spasm of back    Able to ambulate here and hemodynamically stable. No indication for imaging of back at this time given no trauma and normal neurological exam.   Meds ordered this encounter  Medications   methocarbamol 1000 MG TABS    Sig: Take 500 mg by mouth 2 (two) times daily. For back muscle spasm.    Dispense:  20 tablet    Refill:  0   Work/school excuse note: provided x 2. Encourage ROM/movement as tolerated.  Recommend:  Follow-up Information     Grayce Sessions, NP.   Specialty: Internal Medicine Why: As needed. Contact information: [address] [place] [address] [phone]                 Reviewed expectations re: course of current medical issues. Questions answered. Outlined signs and symptoms indicating need for more acute intervention. Patient verbalized understanding. After Visit Summary given.   SUBJECTIVE: History from: patient.  Dennis Matthews is a 32 y.o. male who presents with complaint of persistent left sided thoracic back pain; noted this am; questions feeling a muscle pull while lifting weights yesterday. Denies back trauma. Denies SOB. No tx PTA.  OBJECTIVE:  Vitals:   09/21/23 1033 09/21/23 1034  BP:  (!) 131/92  Pulse:  73  Resp:  16  Temp:  97.8 F (36.6 C)  TempSrc:  Oral  SpO2:  98%  Weight: 91.2 kg   Height: 5\' 5"  (1.651 m)     General appearance: alert; no distress HEENT: [place]; AT Neck: supple with FROM; without midline tenderness Lungs: unlabored respirations; speaks full sentences without difficulty Back: mild  and poorly localized tenderness to palpation over left thoracic paraspinal musculature ; FROM at waist; bruising: none; without midline tenderness Extremities: without edema; symmetrical without gross deformities; normal ROM of bilateral LE Skin: warm  and dry Neurologic: normal gait; normal sensation and strength of bilateral LE Psychological: alert and cooperative; normal mood and affect    Allergies  Allergen Reactions   Nsaids     Past Medical History:  Diagnosis Date   Renal disorder    Social History   Socioeconomic History   Marital status: Single    Spouse name: Not on file   Number of children: Not on file   Years of education: Not on file   Highest education level: Not on file  Occupational History   Not on file  Tobacco Use   Smoking status: Never   Smokeless tobacco: Never  Vaping Use   Vaping status: Never Used  Substance and Sexual Activity   Alcohol use: Yes   Drug use: Not Currently    Types: Marijuana   Sexual activity: Not on file  Other Topics Concern   Not on file  Social History Narrative   Not on file   Social Drivers of Health   Financial Resource Strain: Low Risk  (03/01/2023)   Received from [facility]   Overall Financial Resource Strain (CARDIA)    Difficulty of Paying Living Expenses: Not very hard  Food Insecurity: No Food Insecurity (03/01/2023)   Received from [facility]   Hunger Vital Sign    Worried About Running Out of Food in the Last Year: Never true    Ran Out of Food in the Last Year: Never true  Transportation Needs: No Transportation Needs (  03/01/2023)   Received from [facility] - Transportation    Lack of Transportation (Medical): No    Lack of Transportation (Non-Medical): No  Physical Activity: Not on file  Stress: Not on file  Social Connections: Unknown (06/08/2022)   Received from [facility]   Social Network    Social Network: Not on file  Intimate Partner Violence: Unknown (06/08/2022)   Received from [facility]   HITS    Physically Hurt: Not on file    Insult or Talk Down To: Not on file    Threaten Physical Harm: Not on file    Scream or Curse: Not on file   No family history on file. History reviewed. No pertinent surgical  history.    Mardella Layman, MD 09/21/23 1115

## 2023-09-21 NOTE — ED Triage Notes (Addendum)
Patient presents with back pain on left side, hurts with movement to the right. No injury noted. Patient treated with Tylenol with minus relief.

## 2023-12-07 ENCOUNTER — Ambulatory Visit: Admission: EM | Admit: 2023-12-07 | Discharge: 2023-12-07 | Disposition: A | Discharge status: Home / Self Care

## 2023-12-07 ENCOUNTER — Encounter: Payer: Self-pay | Admitting: Emergency Medicine

## 2023-12-07 DIAGNOSIS — M25512 Pain in left shoulder: Secondary | ICD-10-CM | POA: Diagnosis not present

## 2023-12-07 DIAGNOSIS — R42 Dizziness and giddiness: Secondary | ICD-10-CM | POA: Diagnosis not present

## 2023-12-07 LAB — POCT FASTING CBG KUC MANUAL ENTRY: POCT Glucose (KUC): 124 mg/dL — AB (ref 70–99)

## 2023-12-07 MED ORDER — PREDNISONE 20 MG PO TABS: 40.0000 mg | ORAL_TABLET | Freq: Every day | ORAL | 0 refills | Status: AC

## 2023-12-07 NOTE — ED Triage Notes (Addendum)
 Pt reports L shoulder pain x4 days. Describes sharp pain with numbness and tingling down his arm. No injury or trauma to area. Can still move through ROM but with increased pain. Reports hx of contact sports and has had pain intermittently in the shoulder since HS. Pt also reports waking up light-headed this morning and has improved since getting up. Just slightly bothering him now. Pt reports last yr being told he was prediabetic and had not followed up with anyone. Would like glucose checked today.

## 2023-12-07 NOTE — ED Provider Notes (Signed)
 [facility]    CSN: [id number] Arrival date & time: 12/07/23  8413      History   Chief Complaint Chief Complaint  Patient presents with   Shoulder Pain   Dizziness    HPI Dennis Matthews is a 33 y.o. male.   Patient today for evaluation of left shoulder pain that is been ongoing for several years.  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  He notes that recently has had more pain with certain movements.  He states pain is present mostly to posterior left shoulder and occasionally he will have some numbness and tingling radiate down his arm.  He is taking Tylenol with mild relief.  He denies any chest pain or shortness of breath.     He did note some lightheadedness when he woke this morning but this is cleared.  He was somewhat concerned for his glucose levels as he has been told he was prediabetic in the past.  The history is provided by the patient.  Shoulder Pain Associated symptoms: no fever   Dizziness Associated symptoms: no chest pain and no shortness of breath     Past Medical History:  Diagnosis Date   Renal disorder     There are no active problems to display for this patient.   History reviewed. No pertinent surgical history.     Home Medications    Prior to Admission medications   Medication Sig Start Date End Date Taking? Authorizing Provider  doxycycline (VIBRA-TABS) 100 MG tablet Take 100 mg by mouth 2 (two) times daily. Patient not taking: Reported on 12/07/2023 10/21/23   [provider]  predniSONE (DELTASONE) 20 MG tablet Take 2 tablets (40 mg total) by mouth daily with breakfast for 5 days. 12/07/23 12/12/23 Yes Tomi Bamberger, PA-C  methocarbamol 1000 MG TABS Take 500 mg by mouth 2 (two) times daily. For back muscle spasm. Patient not taking: Reported on 12/07/2023 09/21/23   Mardella Layman, MD    Family History History reviewed. No  pertinent family history.  Social History Social History   Tobacco Use   Smoking status: Never   Smokeless tobacco: Never  Vaping Use   Vaping status: Never Used  Substance Use Topics   Alcohol use: Yes   Drug use: Not Currently    Types: Marijuana     Allergies   Nsaids   Review of Systems Review of Systems  Constitutional:  Negative for chills and fever.  Eyes:  Negative for discharge and redness.  Respiratory:  Negative for shortness of breath.   Cardiovascular:  Negative for chest pain.  Musculoskeletal:  Positive for arthralgias.  Neurological:  Positive for light-headedness. Negative for dizziness and numbness.     Physical Exam Triage Vital Signs ED Triage Vitals  Encounter Vitals Group     BP 12/07/23 0848 (!) 135/91     Systolic BP Percentile --      Diastolic BP Percentile --      Pulse Rate 12/07/23 0848 81     Resp 12/07/23 0848 16     Temp 12/07/23 0848 98.1 F (36.7 C)     Temp Source 12/07/23 0848 Oral     SpO2 12/07/23 0848 96 %     Weight --      Height --      Head Circumference --      Peak Flow --  Pain Score 12/07/23 0849 7     Pain Loc --      Pain Education --      Exclude from Growth Chart --    No data found.  Updated Vital Signs BP (!) 135/91 (BP Location: Left Arm)   Pulse 81   Temp 98.1 F (36.7 C) (Oral)   Resp 16   SpO2 96%   Visual Acuity Right Eye Distance:   Left Eye Distance:   Bilateral Distance:    Right Eye Near:   Left Eye Near:    Bilateral Near:     Physical Exam Vitals and nursing note reviewed.  Constitutional:      General: He is not in acute distress.    Appearance: Normal appearance. He is not ill-appearing.  HENT:     Head: Normocephalic and atraumatic.  Eyes:     Conjunctiva/sclera: Conjunctivae normal.  Cardiovascular:     Rate and Rhythm: Normal rate.  Pulmonary:     Effort: Pulmonary effort is normal. No respiratory distress.  Musculoskeletal:     Comments: Full range of motion  of the left shoulder with no tenderness palpation noted, pain is noted at extremes of range of motion  Neurological:     Mental Status: He is alert.  Psychiatric:        Mood and Affect: Mood normal.        Behavior: Behavior normal.        Thought Content: Thought content normal.      UC Treatments / Results  Labs (all labs ordered are listed, but only abnormal results are displayed) Labs Reviewed  POCT FASTING CBG [facility] MANUAL ENTRY - Abnormal; Notable for the following components:      Result Value   POCT Glucose ([facility]) 124 (*)    All other components within normal limits    EKG   Radiology No results found.  Procedures Procedures (including critical care time)  Medications Ordered in UC Medications - No data to display  Initial Impression / Assessment and Plan / UC Course  I have reviewed the triage vital signs and the nursing notes.  Pertinent labs & imaging results that were available during my care of the patient were reviewed by me and considered in my medical decision making (see chart for details).    Reassured patient glucose level is normal for nonfasting.  Recommended continued monitoring and follow-up with any concerns.  Will treat suspected inflammatory cause of shoulder pain with steroid burst and advised follow-up with Ortho if no gradual improvement with any worsening symptoms.  Discussed avoidance of NSAIDs while taking prednisone.  Final Clinical Impressions(s) / UC Diagnoses   Final diagnoses:  Acute pain of left shoulder  Intermittent lightheadedness   Discharge Instructions   None    ED Prescriptions     Medication Sig Dispense Auth. Provider   predniSONE (DELTASONE) 20 MG tablet Take 2 tablets (40 mg total) by mouth daily with breakfast for 5 days. 10 tablet Tomi Bamberger, PA-C      PDMP not reviewed this encounter.   Tomi Bamberger, PA-C 12/07/23 1001

## 2024-01-01 ENCOUNTER — Encounter: Payer: Self-pay | Admitting: Emergency Medicine

## 2024-01-01 ENCOUNTER — Ambulatory Visit
Admission: EM | Admit: 2024-01-01 | Discharge: 2024-01-01 | Disposition: A | Discharge status: Home / Self Care | Attending: Internal Medicine | Admitting: Internal Medicine

## 2024-01-01 DIAGNOSIS — Z113 Encounter for screening for infections with a predominantly sexual mode of transmission: Secondary | ICD-10-CM | POA: Insufficient documentation

## 2024-01-01 NOTE — ED Triage Notes (Signed)
 Pt presents wanting to get Std tested. Pt says he tested pos for Chlamydia in January but didn't finish the RX. He is wanting to be swabbed and blood tested.

## 2024-01-01 NOTE — ED Provider Notes (Signed)
 [facility]    CSN: [id number] Arrival date & time: 01/01/24  1114      History   Chief Complaint Chief Complaint  Patient presents with   STD Testing     HPI Dennis Matthews is a 33 y.o. male.   Patient presents today for STD testing.  Reports that he was seen at a different urgent care in January and tested positive for chlamydia.  He was having penile irritation at that time.  He was prescribed doxycycline but only took it for about 3 to 4 days.  Reports improvement in symptoms at that time, but they have seemed to return over the past few days.  Reports that he has not been sexually active since being tested and has no new exposures to STD.  Patient requesting blood work for HIV and syphilis as well.     Past Medical History:  Diagnosis Date   Renal disorder     There are no active problems to display for this patient.   History reviewed. No pertinent surgical history.     Home Medications    Prior to Admission medications   Medication Sig Start Date End Date Taking? Authorizing Provider  doxycycline (VIBRA-TABS) 100 MG tablet Take 100 mg by mouth 2 (two) times daily. Patient not taking: Reported on 12/07/2023 10/21/23   [provider]  methocarbamol 1000 MG TABS Take 500 mg by mouth 2 (two) times daily. For back muscle spasm. Patient not taking: Reported on 12/07/2023 09/21/23   Mardella Layman, MD    Family History History reviewed. No pertinent family history.  Social History Social History   Tobacco Use   Smoking status: Never   Smokeless tobacco: Never  Vaping Use   Vaping status: Never Used  Substance Use Topics   Alcohol use: Yes   Drug use: Not Currently    Types: Marijuana     Allergies   Nsaids   Review of Systems Review of Systems Per HPI  Physical Exam Triage Vital Signs ED Triage Vitals  Encounter Vitals Group     BP 01/01/24 1138 (!) 138/99     Systolic BP Percentile --      Diastolic BP Percentile --       Pulse Rate 01/01/24 1138 75     Resp 01/01/24 1138 18     Temp 01/01/24 1138 97.6 F (36.4 C)     Temp Source 01/01/24 1138 Oral     SpO2 01/01/24 1138 97 %     Weight 01/01/24 1136 201 lb 1 oz (91.2 kg)     Height 01/01/24 1136 5\' 5"  (1.651 m)     Head Circumference --      Peak Flow --      Pain Score 01/01/24 1136 0     Pain Loc --      Pain Education --      Exclude from Growth Chart --    No data found.  Updated Vital Signs BP (!) 138/99 (BP Location: Left Arm)   Pulse 75   Temp 97.6 F (36.4 C) (Oral)   Resp 18   Ht 5\' 5"  (1.651 m)   Wt 201 lb 1 oz (91.2 kg)   SpO2 97%   BMI 33.46 kg/m   Visual Acuity Right Eye Distance:   Left Eye Distance:   Bilateral Distance:    Right Eye Near:   Left Eye Near:    Bilateral Near:     Physical Exam Constitutional:  General: He is not in acute distress.    Appearance: Normal appearance. He is not toxic-appearing or diaphoretic.  HENT:     Head: Normocephalic and atraumatic.  Eyes:     Extraocular Movements: Extraocular movements intact.     Conjunctiva/sclera: Conjunctivae normal.  Pulmonary:     Effort: Pulmonary effort is normal.  Genitourinary:    Comments: Deferred with shared decision making. Self swab performed.  Neurological:     General: No focal deficit present.     Mental Status: He is alert and oriented to person, place, and time. Mental status is at baseline.  Psychiatric:        Mood and Affect: Mood normal.        Behavior: Behavior normal.        Thought Content: Thought content normal.        Judgment: Judgment normal.      UC Treatments / Results  Labs (all labs ordered are listed, but only abnormal results are displayed) Labs Reviewed  RPR  HIV ANTIBODY (ROUTINE TESTING W REFLEX)  CYTOLOGY, (ORAL, ANAL, URETHRAL) ANCILLARY ONLY    EKG   Radiology No results found.  Procedures Procedures (including critical care time)  Medications Ordered in UC Medications - No data to  display  Initial Impression / Assessment and Plan / UC Course  I have reviewed the triage vital signs and the nursing notes.  Pertinent labs & imaging results that were available during my care of the patient were reviewed by me and considered in my medical decision making (see chart for details).     Cytology swab, HIV, RPR test pending.  Will await swab prior to treatment at this time.  Advised strict follow-up precautions.  Patient verbalized understanding and was agreeable with plan. Final Clinical Impressions(s) / UC Diagnoses   Final diagnoses:  Screening examination for venereal disease     Discharge Instructions      STD testing is pending.  We will call if anything is positive.  Please refrain from sexual activity until test results and treatment are complete.    ED Prescriptions   None    PDMP not reviewed this encounter.   Gustavus Bryant, [place] 01/01/24 [phone]

## 2024-01-01 NOTE — Discharge Instructions (Signed)
 STD testing is pending.  We will call if anything is positive.  Please refrain from sexual activity until test results and treatment are complete.

## 2024-01-02 LAB — CYTOLOGY, (ORAL, ANAL, URETHRAL) ANCILLARY ONLY
Chlamydia: NEGATIVE
Comment: NEGATIVE
Comment: NEGATIVE
Comment: NORMAL
Neisseria Gonorrhea: NEGATIVE
Trichomonas: NEGATIVE

## 2024-01-02 LAB — RPR: RPR Ser Ql: NONREACTIVE

## 2024-01-02 LAB — HIV ANTIBODY (ROUTINE TESTING W REFLEX): HIV Screen 4th Generation wRfx: NONREACTIVE

## 2024-03-11 ENCOUNTER — Encounter: Payer: Self-pay | Admitting: Emergency Medicine

## 2024-03-11 ENCOUNTER — Ambulatory Visit
Admission: EM | Admit: 2024-03-11 | Discharge: 2024-03-11 | Disposition: A | Discharge status: Home / Self Care | Attending: Physician Assistant | Admitting: Physician Assistant

## 2024-03-11 DIAGNOSIS — K219 Gastro-esophageal reflux disease without esophagitis: Secondary | ICD-10-CM | POA: Diagnosis not present

## 2024-03-11 LAB — POCT FASTING CBG KUC MANUAL ENTRY: POCT Glucose (KUC): 104 mg/dL — AB (ref 70–99)

## 2024-03-11 MED ORDER — SUCRALFATE 1 G PO TABS: 1.0000 g | ORAL_TABLET | Freq: Three times a day (TID) | ORAL | 0 refills | Status: AC

## 2024-03-11 MED ORDER — FAMOTIDINE 20 MG PO TABS: 20.0000 mg | ORAL_TABLET | Freq: Every day | ORAL | 0 refills | Status: AC

## 2024-03-11 MED ORDER — ALUM & MAG HYDROXIDE-SIMETH 200-200-20 MG/5ML PO SUSP
30.0000 mL | Freq: Once | ORAL | Status: AC
  Administered 2024-03-11: 30 mL via ORAL

## 2024-03-11 MED ORDER — FAMOTIDINE 40 MG PO TABS
40.0000 mg | ORAL_TABLET | Freq: Once | ORAL | Status: AC
  Administered 2024-03-11: 40 mg via ORAL

## 2024-03-11 NOTE — ED Triage Notes (Signed)
 Pt presents c/o abdominal pain, fever, and face tightness since this afternoon (around 1:30pm). Pt says he has not came in contact with any known allergens. Pt had grilled chicken that had been in the fridge for 4 days.

## 2024-03-11 NOTE — ED Provider Notes (Signed)
 [facility]    CSN: [id number] Arrival date & time: 03/11/24  1616      History   Chief Complaint Chief Complaint  Patient presents with   Abdominal Pain   Fever   face tightness    HPI Dennis Matthews is a 33 y.o. male.   Patient presents today for evaluation after developing abdominal discomfort and feeling poorly after eating something unusual.  Reports that for lunch he had a leftover burger and a piece of chicken that had been in the refrigerator for a few days.  Approximately an hour and a half after eating these he developed upper abdominal discomfort with associated nausea and felt a tingling sensation in his face.  He denies any swelling of his throat, shortness of breath, muffled voice, wheezing, vomiting, diarrhea.  Denies any history of environmental or food allergies.  He reports that this was food that he had cooked and denies any new exposures.  Denies any changes in medications.  He does have a history of anxiety and reports that often when he has an abnormal sensation he starts to feel panicked and wonders if this could be contributing to his symptoms.  He is feeling highly anxious currently.  He denies any history of gastrointestinal disorder.  Denies any previous abdominal surgery.    Past Medical History:  Diagnosis Date   Renal disorder     There are no active problems to display for this patient.   History reviewed. No pertinent surgical history.     Home Medications    Prior to Admission medications   Medication Sig Start Date End Date Taking? Authorizing Provider  famotidine (PEPCID) 20 MG tablet Take 1 tablet (20 mg total) by mouth at bedtime. 03/11/24  Yes Somnang Mahan K, PA-C  sucralfate (CARAFATE) 1 g tablet Take 1 tablet (1 g total) by mouth 4 (four) times daily -  with meals and at bedtime. 03/11/24  Yes Kerem Gilmer, Betsey Brow, PA-C    Family History History reviewed. No pertinent family history.  Social History Social History    Tobacco Use   Smoking status: Never    Passive exposure: Never   Smokeless tobacco: Never  Vaping Use   Vaping status: Never Used  Substance Use Topics   Alcohol use: Yes   Drug use: Not Currently    Types: Marijuana     Allergies   Nsaids   Review of Systems Review of Systems  Constitutional:  Positive for activity change. Negative for appetite change, fatigue and fever.  HENT:  Negative for sore throat, trouble swallowing and voice change.   Respiratory:  Negative for shortness of breath.   Cardiovascular:  Negative for chest pain.  Gastrointestinal:  Positive for abdominal pain and nausea. Negative for constipation, diarrhea and vomiting.  Neurological:  Negative for dizziness, light-headedness and headaches.     Physical Exam Triage Vital Signs ED Triage Vitals  Encounter Vitals Group     BP 03/11/24 1716 (!) 143/98     Systolic BP Percentile --      Diastolic BP Percentile --      Pulse Rate 03/11/24 1716 86     Resp 03/11/24 1716 16     Temp 03/11/24 1716 98.2 F (36.8 C)     Temp Source 03/11/24 1716 Oral     SpO2 03/11/24 1716 97 %     Weight 03/11/24 1715 201 lb 1 oz (91.2 kg)     Height --  Head Circumference --      Peak Flow --      Pain Score 03/11/24 1715 0     Pain Loc --      Pain Education --      Exclude from Growth Chart --    No data found.  Updated Vital Signs BP (!) 143/98 (BP Location: Left Arm)   Pulse 86   Temp 98.2 F (36.8 C) (Oral)   Resp 16   Wt 201 lb 1 oz (91.2 kg)   SpO2 97%   BMI 33.46 kg/m   Visual Acuity Right Eye Distance:   Left Eye Distance:   Bilateral Distance:    Right Eye Near:   Left Eye Near:    Bilateral Near:     Physical Exam Vitals reviewed.  Constitutional:      General: He is awake.     Appearance: Normal appearance. He is well-developed. He is not ill-appearing.     Comments: Very pleasant male presented age in no acute distress sitting comfortably in exam room  HENT:     Head:  Normocephalic and atraumatic.     Mouth/Throat:     Pharynx: Uvula midline. No oropharyngeal exudate or posterior oropharyngeal erythema.     Comments: Normal-appearing posterior oropharynx Cardiovascular:     Rate and Rhythm: Normal rate and regular rhythm.     Heart sounds: Normal heart sounds, S1 normal and S2 normal. No murmur heard. Pulmonary:     Effort: Pulmonary effort is normal.     Breath sounds: Normal breath sounds. No stridor. No wheezing, rhonchi or rales.     Comments: Clear to auscultation bilaterally Abdominal:     General: Bowel sounds are normal.     Palpations: Abdomen is soft.     Tenderness: There is no abdominal tenderness. There is no right CVA tenderness, left CVA tenderness, guarding or rebound.     Comments: Benign abdominal exam  Skin:    Findings: No rash.  Neurological:     Mental Status: He is alert.  Psychiatric:        Behavior: Behavior is cooperative.      UC Treatments / Results  Labs (all labs ordered are listed, but only abnormal results are displayed) Labs Reviewed  POCT FASTING CBG [facility] MANUAL ENTRY - Abnormal; Notable for the following components:      Result Value   POCT Glucose ([facility]) 104 (*)    All other components within normal limits    EKG   Radiology No results found.  Procedures Procedures (including critical care time)  Medications Ordered in UC Medications  alum & mag hydroxide-simeth (MAALOX/MYLANTA) 200-200-20 MG/5ML suspension 30 mL (30 mLs Oral Given 03/11/24 1829)  famotidine (PEPCID) tablet 40 mg (40 mg Oral Given 03/11/24 1829)    Initial Impression / Assessment and Plan / UC Course  I have reviewed the triage vital signs and the nursing notes.  Pertinent labs & imaging results that were available during my care of the patient were reviewed by me and considered in my medical decision making (see chart for details).     Patient is well-appearing, afebrile, nontoxic, nontachycardic.  Vital signs and physical  exam are reassuring.  He was given Maalox and Pepcid with improvement of symptoms.  I suspect that the food that he ate earlier caused GI upset.  As he was leaving he was concerned that his symptoms could be related to diabetes as he has a family history of this and so  we did check his blood sugar but this was appropriate given he was not fasting at 104.  Recommended that he eat a bland diet and drink plenty of fluid.  He is to avoid alcohol and NSAIDs.  Will start Carafate as well as Pepcid to manage his symptoms.  We discussed that if anything changes or worsens that he has severe abdominal pain, fever, nausea, vomiting, melena, hematochezia, chest pain, shortness of breath, weakness he needs to be seen emergently.  Strict return precautions given.  Work excuse note provided.  Final Clinical Impressions(s) / UC Diagnoses   Final diagnoses:  Gastroesophageal reflux disease without esophagitis     Discharge Instructions      I am glad that you are feeling better after the medication.  I suspect that something you ate did not agree with you.  Use Carafate before each meal and before bed for the next week.  Eat a bland diet and drink plenty of fluid.  Take Pepcid at night starting tomorrow (03/12/2024) since we gave you a dose today.  Avoid NSAIDs (aspirin, ibuprofen/Advil, naproxen/Aleve) as well as alcohol.  If anything changes or worsens and you have increasing pain, swelling of your throat, nausea, vomiting, blood in your stool, blood in your vomit, weakness you need to go to the ER immediately.   ED Prescriptions     Medication Sig Dispense Auth. Provider   sucralfate (CARAFATE) 1 g tablet Take 1 tablet (1 g total) by mouth 4 (four) times daily -  with meals and at bedtime. 28 tablet Avelyn Touch K, PA-C   famotidine (PEPCID) 20 MG tablet Take 1 tablet (20 mg total) by mouth at bedtime. 30 tablet Jamarquis Crull K, PA-C      PDMP not reviewed this encounter.   Budd Cargo, PA-C 03/11/24  1858

## 2024-03-11 NOTE — Discharge Instructions (Signed)
 I am glad that you are feeling better after the medication.  I suspect that something you ate did not agree with you.  Use Carafate before each meal and before bed for the next week.  Eat a bland diet and drink plenty of fluid.  Take Pepcid at night starting tomorrow (03/12/2024) since we gave you a dose today.  Avoid NSAIDs (aspirin, ibuprofen/Advil, naproxen/Aleve) as well as alcohol.  If anything changes or worsens and you have increasing pain, swelling of your throat, nausea, vomiting, blood in your stool, blood in your vomit, weakness you need to go to the ER immediately.

## 2024-05-14 ENCOUNTER — Ambulatory Visit
Admission: EM | Admit: 2024-05-14 | Discharge: 2024-05-14 | Disposition: A | Discharge status: Home / Self Care | Attending: Family Medicine | Admitting: Family Medicine

## 2024-05-14 DIAGNOSIS — B349 Viral infection, unspecified: Secondary | ICD-10-CM | POA: Diagnosis not present

## 2024-05-14 DIAGNOSIS — R509 Fever, unspecified: Secondary | ICD-10-CM

## 2024-05-14 LAB — POCT URINE DIPSTICK
Bilirubin, UA: NEGATIVE
Blood, UA: NEGATIVE
Glucose, UA: NEGATIVE mg/dL
Ketones, POC UA: NEGATIVE mg/dL
Leukocytes, UA: NEGATIVE
Nitrite, UA: NEGATIVE
POC PROTEIN,UA: NEGATIVE
Spec Grav, UA: 1.015 (ref 1.010–1.025)
Urobilinogen, UA: 0.2 U/dL
pH, UA: 7 (ref 5.0–8.0)

## 2024-05-14 LAB — POC COVID19/FLU A&B COMBO
Covid Antigen, POC: NEGATIVE
Influenza A Antigen, POC: NEGATIVE
Influenza B Antigen, POC: NEGATIVE

## 2024-05-14 MED ORDER — ONDANSETRON 4 MG PO TBDP: 4.0000 mg | ORAL_TABLET | Freq: Three times a day (TID) | ORAL | 0 refills | Status: AC | PRN

## 2024-05-14 NOTE — ED Triage Notes (Signed)
 Also, my girlfriend has a UTI (with E-Coli) in it, just in case that matters.

## 2024-05-14 NOTE — ED Triage Notes (Signed)
 When getting up this morning I was light-headed, then Fever's (feeling hot/cold), started with back pain that remains, it hasn't improved since this morning, the 2nd time I went to urinate it had a foul smell to it (no pain or discomfort). Now I feel some pain/discomfort in my abd area. Some nausea (no vomiting). Stools normal. No new/unexplained rash. No runny nose or cough.

## 2024-05-14 NOTE — Discharge Instructions (Signed)
 The urinalysis is normal  The COVID and flu test is negative.  Ondansetron  dissolved in the mouth every 8 hours as needed for nausea or vomiting. Clear liquids(water, gatorade/pedialyte, ginger ale/sprite, chicken broth/soup) and bland things(crackers/toast, rice, potato, bananas) to eat. Avoid acidic foods like lemon/lime/orange/tomato, and avoid greasy/spicy foods.  I recommend Tylenol  500 mg over-the-counter--2 every 6 hours as needed for pain or fever.

## 2024-05-14 NOTE — ED Provider Notes (Signed)
 [facility]    CSN: [id number] Arrival date & time: 05/14/24  1451      History   Chief Complaint Chief Complaint  Patient presents with   Fever   Osmidrosis    HPI Dennis Matthews is a 33 y.o. male.    Fever Here for subjective fever and myalgia and dizziness.  Symptoms began this morning.  He has had nausea but no vomiting or diarrhea.  No dysuria, but his urine has smelled bad to him.  No congestion or cough.  NSAIDs cause dizziness.     Past Medical History:  Diagnosis Date   Renal disorder     There are no active problems to display for this patient.   History reviewed. No pertinent surgical history.     Home Medications    Prior to Admission medications   Medication Sig Start Date End Date Taking? Authorizing Provider  ondansetron  (ZOFRAN -ODT) 4 MG disintegrating tablet Take 1 tablet (4 mg total) by mouth every 8 (eight) hours as needed for nausea or vomiting. 05/14/24  Yes Mercedez Boule, Sharlet POUR, MD  famotidine  (PEPCID ) 20 MG tablet Take 1 tablet (20 mg total) by mouth at bedtime. 03/11/24   Raspet, Erin K, PA-C  sucralfate  (CARAFATE ) 1 g tablet Take 1 tablet (1 g total) by mouth 4 (four) times daily -  with meals and at bedtime. 03/11/24   Raspet, Rocky POUR, PA-C    Family History History reviewed. No pertinent family history.  Social History Social History   Tobacco Use   Smoking status: Some Days    Types: Cigarettes, Cigars    Passive exposure: Never   Smokeless tobacco: Never   Tobacco comments:    Black and milds  Vaping Use   Vaping status: Never Used  Substance Use Topics   Alcohol use: Yes    Comment: Occassionally.   Drug use: Not Currently    Types: Marijuana     Allergies   Nsaids   Review of Systems Review of Systems  Constitutional:  Positive for fever.     Physical Exam Triage Vital Signs ED Triage Vitals  Encounter Vitals Group     BP 05/14/24 1527 109/73     Girls Systolic BP Percentile --      Girls  Diastolic BP Percentile --      Boys Systolic BP Percentile --      Boys Diastolic BP Percentile --      Pulse Rate 05/14/24 1527 64     Resp 05/14/24 1527 18     Temp 05/14/24 1527 97.9 F (36.6 C)     Temp Source 05/14/24 1527 Oral     SpO2 05/14/24 1527 96 %     Weight 05/14/24 1524 190 lb (86.2 kg)     Height 05/14/24 1524 5' 5 (1.651 m)     Head Circumference --      Peak Flow --      Pain Score 05/14/24 1520 6     Pain Loc --      Pain Education --      Exclude from Growth Chart --    No data found.  Updated Vital Signs BP 109/73 (BP Location: Right Arm)   Pulse 64   Temp 97.9 F (36.6 C) (Oral)   Resp 18   Ht 5' 5 (1.651 m)   Wt 86.2 kg   SpO2 96%   BMI 31.62 kg/m   Visual Acuity Right Eye Distance:   Left Eye Distance:  Bilateral Distance:    Right Eye Near:   Left Eye Near:    Bilateral Near:     Physical Exam Vitals reviewed.  Constitutional:      General: He is not in acute distress.    Appearance: He is not toxic-appearing.  HENT:     Nose: Nose normal.     Mouth/Throat:     Mouth: Mucous membranes are moist.     Pharynx: No oropharyngeal exudate or posterior oropharyngeal erythema.  Eyes:     Extraocular Movements: Extraocular movements intact.     Conjunctiva/sclera: Conjunctivae normal.     Pupils: Pupils are equal, round, and reactive to light.  Cardiovascular:     Rate and Rhythm: Normal rate and regular rhythm.     Heart sounds: No murmur heard. Pulmonary:     Effort: Pulmonary effort is normal. No respiratory distress.     Breath sounds: Normal breath sounds. No stridor. No wheezing, rhonchi or rales.  Abdominal:     Palpations: Abdomen is soft.     Tenderness: There is no abdominal tenderness.  Musculoskeletal:     Cervical back: Neck supple.  Lymphadenopathy:     Cervical: No cervical adenopathy.  Skin:    Coloration: Skin is not jaundiced or pale.  Neurological:     General: No focal deficit present.     Mental Status:  He is alert and oriented to person, place, and time.  Psychiatric:        Behavior: Behavior normal.      UC Treatments / Results  Labs (all labs ordered are listed, but only abnormal results are displayed) Labs Reviewed  POCT URINE DIPSTICK - Abnormal; Notable for the following components:      Result Value   Color, UA light yellow (*)    All other components within normal limits  POC COVID19/FLU A&B COMBO - Normal    EKG   Radiology No results found.  Procedures Procedures (including critical care time)  Medications Ordered in UC Medications - No data to display  Initial Impression / Assessment and Plan / UC Course  I have reviewed the triage vital signs and the nursing notes.  Pertinent labs & imaging results that were available during my care of the patient were reviewed by me and considered in my medical decision making (see chart for details).    {The patient has been seen in Urgent Care in the last 3 years. :1 Urinalysis is negative as expected. COVID and flu swab is negative.  I discussed with him that this most likely some other viral illness.  Zofran  is sent in for the nausea and he will take Tylenol  as needed for the aches.  Work note provided  Final Clinical Impressions(s) / UC Diagnoses   Final diagnoses:  Fever, unspecified  Viral illness     Discharge Instructions      The urinalysis is normal  The COVID and flu test is negative.  Ondansetron  dissolved in the mouth every 8 hours as needed for nausea or vomiting. Clear liquids(water, gatorade/pedialyte, ginger ale/sprite, chicken broth/soup) and bland things(crackers/toast, rice, potato, bananas) to eat. Avoid acidic foods like lemon/lime/orange/tomato, and avoid greasy/spicy foods.  I recommend Tylenol  500 mg over-the-counter--2 every 6 hours as needed for pain or fever.       ED Prescriptions     Medication Sig Dispense Auth. Provider   ondansetron  (ZOFRAN -ODT) 4 MG disintegrating  tablet Take 1 tablet (4 mg total) by mouth every 8 (eight) hours as  needed for nausea or vomiting. 10 tablet Vonna Morgon Pamer K, MD      PDMP not reviewed this encounter.   Vonna Sharlet POUR, MD 05/14/24 [id number]

## 2024-06-03 HISTORY — PX: WISDOM TOOTH EXTRACTION: SHX21

## 2024-07-04 ENCOUNTER — Ambulatory Visit: Admission: EM | Admit: 2024-07-04 | Discharge: 2024-07-04 | Disposition: A | Discharge status: Home / Self Care

## 2024-07-04 ENCOUNTER — Encounter: Payer: Self-pay | Admitting: Emergency Medicine

## 2024-07-04 DIAGNOSIS — J029 Acute pharyngitis, unspecified: Secondary | ICD-10-CM | POA: Diagnosis not present

## 2024-07-04 LAB — POCT RAPID STREP A (OFFICE): Rapid Strep A Screen: NEGATIVE

## 2024-07-04 MED ORDER — PREDNISONE 20 MG PO TABS: 40.0000 mg | ORAL_TABLET | Freq: Every day | ORAL | 0 refills | Status: AC

## 2024-07-04 MED ORDER — AZELASTINE HCL 0.1 % NA SOLN: 1.0000 | Freq: Two times a day (BID) | NASAL | 1 refills | Status: AC

## 2024-07-04 NOTE — ED Triage Notes (Addendum)
 Pt reports sore throat, fever, headache, and nausea that started yesterday. Denies cough, congestion, or runny nose. Max temp: 101 this morning. Pt notes he started smoking cigarettes 2 weeks ago and is unsure if that could cause symptoms. Denies emesis, diarrhea, and constipation. Tylenol  taken with some relief. Had wisdom teeth removed 1 month ago.

## 2024-07-04 NOTE — Discharge Instructions (Addendum)
  1. Acute viral pharyngitis (Primary) - POCT rapid strep A complete and UC is negative for strep pharyngitis - azelastine (ASTELIN) 0.1 % nasal spray; Place 1 spray into both nostrils 2 (two) times daily. Use in each nostril as directed  Dispense: 30 mL; Refill: 1 - predniSONE  (DELTASONE ) 20 MG tablet; Take 2 tablets (40 mg total) by mouth daily for 5 days.  Dispense: 10 tablet; Refill: 0 -Continue to monitor symptoms for any change in severity if there is any escalation of current symptoms or development of new symptoms follow-up in UC for further evaluation and management.

## 2024-07-04 NOTE — ED Provider Notes (Signed)
 [facility]  Note:  This document was prepared using Dragon voice recognition software and may include unintentional dictation errors.  MRN: [id number] DOB: 1990/12/12  Subjective:   Dennis Matthews is a 33 y.o. male presenting for sore throat, fever, headache, nausea x 2 days.  Patient denies any known sick contacts.  Denies any cough, nasal congestion, runny nose, body aches, fatigue.  Patient reports that temp was 101 this morning.  Patient denies any diarrhea, constipation, nausea/ vomiting.  Patient reports taking Tylenol  with some relief but denies any severe improvement.  No current facility-administered medications for this encounter.  Current Outpatient Medications:    azelastine (ASTELIN) 0.1 % nasal spray, Place 1 spray into both nostrils 2 (two) times daily. Use in each nostril as directed, Disp: 30 mL, Rfl: 1   predniSONE  (DELTASONE ) 20 MG tablet, Take 2 tablets (40 mg total) by mouth daily for 5 days., Disp: 10 tablet, Rfl: 0   famotidine  (PEPCID ) 20 MG tablet, Take 1 tablet (20 mg total) by mouth at bedtime. (Patient not taking: Reported on 07/04/2024), Disp: 30 tablet, Rfl: 0   HYDROcodone-acetaminophen  (NORCO/VICODIN) 5-325 MG tablet, Take 1 tablet by mouth every 4 (four) hours as needed. (Patient not taking: Reported on 07/04/2024), Disp: , Rfl:    ondansetron  (ZOFRAN -ODT) 4 MG disintegrating tablet, Take 1 tablet (4 mg total) by mouth every 8 (eight) hours as needed for nausea or vomiting. (Patient not taking: Reported on 07/04/2024), Disp: 10 tablet, Rfl: 0   sucralfate  (CARAFATE ) 1 g tablet, Take 1 tablet (1 g total) by mouth 4 (four) times daily -  with meals and at bedtime. (Patient not taking: Reported on 07/04/2024), Disp: 28 tablet, Rfl: 0   Allergies  Allergen Reactions   Nsaids     Other Reaction(s): Chest Pain    Past Medical History:  Diagnosis Date   Renal disorder      Past Surgical History:  Procedure Laterality Date   WISDOM TOOTH  EXTRACTION Bilateral 06/03/2024    History reviewed. No pertinent family history.  Social History   Tobacco Use   Smoking status: Some Days    Types: Cigarettes, Cigars    Passive exposure: Never   Smokeless tobacco: Never   Tobacco comments:    Black and milds  Vaping Use   Vaping status: Never Used  Substance Use Topics   Alcohol use: Yes    Comment: Occassionally.   Drug use: Not Currently    Types: Marijuana    ROS Refer to HPI for ROS details.  Objective:   Vitals: BP 118/78 (BP Location: Left Arm)   Pulse 76   Temp 97.7 F (36.5 C) (Oral)   Resp 18   SpO2 96%   Physical Exam Vitals and nursing note reviewed.  Constitutional:      General: He is not in acute distress.    Appearance: He is well-developed. He is not ill-appearing or toxic-appearing.  HENT:     Head: Normocephalic.     Nose: Nose normal. No congestion or rhinorrhea.     Mouth/Throat:     Mouth: Mucous membranes are moist.     Pharynx: Oropharynx is clear. No oropharyngeal exudate or posterior oropharyngeal erythema.  Eyes:     Extraocular Movements: Extraocular movements intact.     Conjunctiva/sclera: Conjunctivae normal.  Cardiovascular:     Rate and Rhythm: Normal rate.  Pulmonary:     Effort: Pulmonary effort is normal. No respiratory distress.     Breath sounds:  No stridor. No wheezing.  Musculoskeletal:     Cervical back: Normal range of motion and neck supple. No rigidity or tenderness.  Lymphadenopathy:     Cervical: No cervical adenopathy.  Skin:    General: Skin is warm and dry.  Neurological:     General: No focal deficit present.     Mental Status: He is alert and oriented to person, place, and time.  Psychiatric:        Mood and Affect: Mood normal.        Behavior: Behavior normal.     Procedures  Results for orders placed or performed during the hospital encounter of 07/04/24 (from the past 24 hours)  POCT rapid strep A     Status: Normal   Collection Time:  07/04/24  5:44 PM  Result Value Ref Range   Rapid Strep A Screen Negative Negative    No results found.   Assessment and Plan :     Discharge Instructions       1. Acute viral pharyngitis (Primary) - POCT rapid strep A complete and UC is negative for strep pharyngitis - azelastine (ASTELIN) 0.1 % nasal spray; Place 1 spray into both nostrils 2 (two) times daily. Use in each nostril as directed  Dispense: 30 mL; Refill: 1 - predniSONE  (DELTASONE ) 20 MG tablet; Take 2 tablets (40 mg total) by mouth daily for 5 days.  Dispense: 10 tablet; Refill: 0 -Continue to monitor symptoms for any change in severity if there is any escalation of current symptoms or development of new symptoms follow-up in UC for further evaluation and management.      Dennis Matthews   Dennis Matthews, [place], [place] 07/04/24 1839

## 2024-08-17 ENCOUNTER — Encounter: Payer: Self-pay | Admitting: *Deleted

## 2024-08-17 ENCOUNTER — Ambulatory Visit: Admission: EM | Admit: 2024-08-17 | Discharge: 2024-08-17 | Disposition: A | Discharge status: Home / Self Care

## 2024-08-17 DIAGNOSIS — Z202 Contact with and (suspected) exposure to infections with a predominantly sexual mode of transmission: Secondary | ICD-10-CM | POA: Diagnosis present

## 2024-08-17 MED ORDER — CEFTRIAXONE SODIUM 500 MG IJ SOLR
500.0000 mg | INTRAMUSCULAR | Status: DC
  Administered 2024-08-17: 500 mg via INTRAMUSCULAR

## 2024-08-17 MED ORDER — DOXYCYCLINE HYCLATE 100 MG PO CAPS: 100.0000 mg | ORAL_CAPSULE | Freq: Two times a day (BID) | ORAL | 0 refills | Status: AC

## 2024-08-17 NOTE — Discharge Instructions (Signed)
 You were tested for STIs today. You should receive your results in 3-5 days. If you have not received a call from the office or see results in your mychart please call the clinic where you were seen. It is best if your refrain from sexual activity until you get results back. If positive you will need to refrain from sexual activity for 2 weeks and be sure to complete any antibiotics prescribed in their entirety. '

## 2024-08-17 NOTE — ED Triage Notes (Signed)
 Pt requesting STI testing. Reports urethra feels irritated and also notes discharge when he squeezes the tip of his penis. Sx x 1 week

## 2024-08-17 NOTE — ED Provider Notes (Signed)
 [facility]    CSN: [id number] Arrival date & time: 08/17/24  1301      History   Chief Complaint Chief Complaint  Patient presents with   Penile Discharge    HPI Dennis Matthews is a 33 y.o. male.   Patient presents today due to penile discharge for [redacted] week along with dysuria.  Patient states that he recently found out that his girlfriend has been having sex with the father of her children.  Patient denies back pain, lower abdominal pain, fever, or chills.  The history is provided by the patient.  Penile Discharge    Past Medical History:  Diagnosis Date   Renal disorder     There are no active problems to display for this patient.   Past Surgical History:  Procedure Laterality Date   WISDOM TOOTH EXTRACTION Bilateral 06/03/2024       Home Medications    Prior to Admission medications   Medication Sig Start Date End Date Taking? Authorizing Provider  doxycycline (VIBRAMYCIN) 100 MG capsule Take 1 capsule (100 mg total) by mouth 2 (two) times daily for 7 days. 08/17/24 08/24/24 Yes Andra Krabbe C, PA-C  azelastine (ASTELIN) 0.1 % nasal spray Place 1 spray into both nostrils 2 (two) times daily. Use in each nostril as directed Patient not taking: Reported on 08/17/2024 07/04/24   Aurea Goodell B, NP  famotidine  (PEPCID ) 20 MG tablet Take 1 tablet (20 mg total) by mouth at bedtime. Patient not taking: Reported on 07/04/2024 03/11/24   Raspet, Erin K, PA-C  HYDROcodone-acetaminophen  (NORCO/VICODIN) 5-325 MG tablet Take 1 tablet by mouth every 4 (four) hours as needed. Patient not taking: Reported on 07/04/2024 06/10/24   [provider]  ondansetron  (ZOFRAN -ODT) 4 MG disintegrating tablet Take 1 tablet (4 mg total) by mouth every 8 (eight) hours as needed for nausea or vomiting. Patient not taking: Reported on 07/04/2024 05/14/24   Vonna Sharlet POUR, MD  sucralfate  (CARAFATE ) 1 g tablet Take 1 tablet (1 g total) by mouth 4 (four) times  daily -  with meals and at bedtime. Patient not taking: Reported on 07/04/2024 03/11/24   Raspet, Rocky POUR, PA-C    Family History History reviewed. No pertinent family history.  Social History Social History   Tobacco Use   Smoking status: Some Days    Types: Cigarettes, Cigars    Passive exposure: Never   Smokeless tobacco: Never   Tobacco comments:    Black and milds  Vaping Use   Vaping status: Never Used  Substance Use Topics   Alcohol use: Yes    Comment: Occassionally.   Drug use: Not Currently    Types: Marijuana     Allergies   Nsaids   Review of Systems Review of Systems  Genitourinary:  Positive for penile discharge.     Physical Exam Triage Vital Signs ED Triage Vitals  Encounter Vitals Group     BP 08/17/24 1357 127/85     Girls Systolic BP Percentile --      Girls Diastolic BP Percentile --      Boys Systolic BP Percentile --      Boys Diastolic BP Percentile --      Pulse Rate 08/17/24 1357 83     Resp 08/17/24 1357 16     Temp 08/17/24 1357 98.2 F (36.8 C)     Temp Source 08/17/24 1357 Oral     SpO2 08/17/24 1357 95 %     Weight --  Height --      Head Circumference --      Peak Flow --      Pain Score 08/17/24 1355 0     Pain Loc --      Pain Education --      Exclude from Growth Chart --    No data found.  Updated Vital Signs BP 127/85 (BP Location: Right Arm)   Pulse 83   Temp 98.2 F (36.8 C) (Oral)   Resp 16   SpO2 95%   Visual Acuity Right Eye Distance:   Left Eye Distance:   Bilateral Distance:    Right Eye Near:   Left Eye Near:    Bilateral Near:     Physical Exam Vitals and nursing note reviewed.  Constitutional:      General: He is not in acute distress.    Appearance: Normal appearance. He is not ill-appearing, toxic-appearing or diaphoretic.  Eyes:     General: No scleral icterus. Cardiovascular:     Rate and Rhythm: Normal rate and regular rhythm.     Heart sounds: Normal heart sounds.  Pulmonary:      Effort: Pulmonary effort is normal. No respiratory distress.     Breath sounds: Normal breath sounds. No wheezing or rhonchi.  Skin:    General: Skin is warm.  Neurological:     Mental Status: He is alert and oriented to person, place, and time.  Psychiatric:        Mood and Affect: Mood normal.        Behavior: Behavior normal.      UC Treatments / Results  Labs (all labs ordered are listed, but only abnormal results are displayed) Labs Reviewed  CYTOLOGY, (ORAL, ANAL, URETHRAL) ANCILLARY ONLY    EKG   Radiology No results found.  Procedures Procedures (including critical care time)  Medications Ordered in UC Medications  cefTRIAXone (ROCEPHIN) injection 500 mg (has no administration in time range)    Initial Impression / Assessment and Plan / UC Course  I have reviewed the triage vital signs and the nursing notes.  Pertinent labs & imaging results that were available during my care of the patient were reviewed by me and considered in my medical decision making (see chart for details).     Final Clinical Impressions(s) / UC Diagnoses   Final diagnoses:  Contact with and (suspected) exposure to infections with a predominantly sexual mode of transmission     Discharge Instructions      You were tested for STIs today. You should receive your results in 3-5 days. If you have not received a call from the office or see results in your mychart please call the clinic where you were seen. It is best if your refrain from sexual activity until you get results back. If positive you will need to refrain from sexual activity for 2 weeks and be sure to complete any antibiotics prescribed in their entirety.      ED Prescriptions     Medication Sig Dispense Auth. Provider   doxycycline (VIBRAMYCIN) 100 MG capsule Take 1 capsule (100 mg total) by mouth 2 (two) times daily for 7 days. 14 capsule Andra Corean BROCKS, PA-C      PDMP not reviewed this encounter.    Andra Corean BROCKS, PA-C 08/17/24 1429

## 2024-08-19 LAB — CYTOLOGY, (ORAL, ANAL, URETHRAL) ANCILLARY ONLY
Chlamydia: NEGATIVE
Comment: NEGATIVE
Comment: NEGATIVE
Comment: NORMAL
Neisseria Gonorrhea: NEGATIVE
Trichomonas: NEGATIVE
